# Patient Record
Sex: Male | Born: 1975 | Race: White | Hispanic: No | Marital: Married | Smoking: Current every day smoker
Health system: Southern US, Community
[De-identification: ages and names within clinical notes are randomized; demographics above are authoritative.]

## PROBLEM LIST (undated history)

## (undated) DIAGNOSIS — Q211 Atrial septal defect: Secondary | ICD-10-CM

## (undated) DIAGNOSIS — Q2112 Patent foramen ovale: Secondary | ICD-10-CM

## (undated) DIAGNOSIS — J45909 Unspecified asthma, uncomplicated: Secondary | ICD-10-CM

## (undated) DIAGNOSIS — E785 Hyperlipidemia, unspecified: Secondary | ICD-10-CM

## (undated) DIAGNOSIS — Z87442 Personal history of urinary calculi: Secondary | ICD-10-CM

## (undated) HISTORY — DX: Atrial septal defect: Q21.1

## (undated) HISTORY — DX: Patent foramen ovale: Q21.12

## (undated) HISTORY — DX: Hyperlipidemia, unspecified: E78.5

---

## 2005-05-09 ENCOUNTER — Emergency Department: Payer: Self-pay | Admitting: Emergency Medicine

## 2007-04-21 ENCOUNTER — Emergency Department: Payer: Self-pay | Admitting: Emergency Medicine

## 2019-04-24 ENCOUNTER — Other Ambulatory Visit: Payer: Self-pay

## 2019-04-24 ENCOUNTER — Telehealth: Payer: Self-pay | Admitting: Urology

## 2019-04-24 ENCOUNTER — Emergency Department: Payer: Federal, State, Local not specified - PPO

## 2019-04-24 ENCOUNTER — Emergency Department
Admission: EM | Admit: 2019-04-24 | Discharge: 2019-04-24 | Disposition: A | Discharge status: Home / Self Care | Payer: Federal, State, Local not specified - PPO | Attending: Student in an Organized Health Care Education/Training Program | Admitting: Student in an Organized Health Care Education/Training Program

## 2019-04-24 DIAGNOSIS — F17200 Nicotine dependence, unspecified, uncomplicated: Secondary | ICD-10-CM | POA: Insufficient documentation

## 2019-04-24 DIAGNOSIS — R3915 Urgency of urination: Secondary | ICD-10-CM | POA: Insufficient documentation

## 2019-04-24 DIAGNOSIS — N201 Calculus of ureter: Secondary | ICD-10-CM | POA: Diagnosis not present

## 2019-04-24 DIAGNOSIS — J45909 Unspecified asthma, uncomplicated: Secondary | ICD-10-CM | POA: Insufficient documentation

## 2019-04-24 DIAGNOSIS — R109 Unspecified abdominal pain: Secondary | ICD-10-CM

## 2019-04-24 DIAGNOSIS — R339 Retention of urine, unspecified: Secondary | ICD-10-CM | POA: Insufficient documentation

## 2019-04-24 DIAGNOSIS — R1030 Lower abdominal pain, unspecified: Secondary | ICD-10-CM | POA: Diagnosis present

## 2019-04-24 HISTORY — DX: Unspecified asthma, uncomplicated: J45.909

## 2019-04-24 LAB — COMPREHENSIVE METABOLIC PANEL
ALT: 13 U/L (ref 0–44)
AST: 18 U/L (ref 15–41)
Albumin: 4.4 g/dL (ref 3.5–5.0)
Alkaline Phosphatase: 62 U/L (ref 38–126)
Anion gap: 11 (ref 5–15)
BUN: 14 mg/dL (ref 6–20)
CO2: 25 mmol/L (ref 22–32)
Calcium: 8.7 mg/dL — ABNORMAL LOW (ref 8.9–10.3)
Chloride: 100 mmol/L (ref 98–111)
Creatinine, Ser: 1.34 mg/dL — ABNORMAL HIGH (ref 0.61–1.24)
GFR calc Af Amer: >60 mL/min (ref >60)
GFR calc non Af Amer: >60 mL/min (ref >60)
Glucose, Bld: 130 mg/dL — ABNORMAL HIGH (ref 70–99)
Potassium: 4.2 mmol/L (ref 3.5–5.1)
Sodium: 136 mmol/L (ref 135–145)
Total Bilirubin: 0.5 mg/dL (ref 0.3–1.2)
Total Protein: 7.8 g/dL (ref 6.5–8.1)

## 2019-04-24 LAB — CBC WITH DIFFERENTIAL/PLATELET
Abs Immature Granulocytes: 0.14 10*3/uL — ABNORMAL HIGH (ref 0.00–0.07)
Basophils Absolute: 0.1 10*3/uL (ref 0.0–0.1)
Basophils Relative: 0 %
Eosinophils Absolute: 0.1 10*3/uL (ref 0.0–0.5)
Eosinophils Relative: 1 %
HCT: 30.5 % — ABNORMAL LOW (ref 39.0–52.0)
Hemoglobin: 9.7 g/dL — ABNORMAL LOW (ref 13.0–17.0)
Immature Granulocytes: 1 %
Lymphocytes Relative: 8 %
Lymphs Abs: 1.4 10*3/uL (ref 0.7–4.0)
MCH: 26 pg (ref 26.0–34.0)
MCHC: 31.8 g/dL (ref 30.0–36.0)
MCV: 81.8 fL (ref 80.0–100.0)
Monocytes Absolute: 1.4 10*3/uL — ABNORMAL HIGH (ref 0.1–1.0)
Monocytes Relative: 8 %
Neutro Abs: 13.5 10*3/uL — ABNORMAL HIGH (ref 1.7–7.7)
Neutrophils Relative %: 82 %
Platelets: 280 10*3/uL (ref 150–400)
RBC: 3.73 MIL/uL — ABNORMAL LOW (ref 4.22–5.81)
RDW: 16.2 % — ABNORMAL HIGH (ref 11.5–15.5)
WBC: 16.5 10*3/uL — ABNORMAL HIGH (ref 4.0–10.5)
nRBC: 0 % (ref 0.0–0.2)

## 2019-04-24 LAB — URINALYSIS, COMPLETE (UACMP) WITH MICROSCOPIC
Bacteria, UA: NONE SEEN
Bilirubin Urine: NEGATIVE
Glucose, UA: NEGATIVE mg/dL
Hgb urine dipstick: NEGATIVE
Ketones, ur: NEGATIVE mg/dL
Leukocytes,Ua: NEGATIVE
Nitrite: NEGATIVE
Protein, ur: NEGATIVE mg/dL
Specific Gravity, Urine: 1.013 (ref 1.005–1.030)
Squamous Epithelial / HPF: NONE SEEN (ref 0–5)
pH: 8 (ref 5.0–8.0)

## 2019-04-24 LAB — TYPE AND SCREEN
ABO/RH(D): O NEG
Antibody Screen: NEGATIVE

## 2019-04-24 MED ORDER — ONDANSETRON HCL 4 MG/2ML IJ SOLN
INTRAMUSCULAR | Status: AC
  Filled 2019-04-24: qty 2

## 2019-04-24 MED ORDER — HYDROCODONE-ACETAMINOPHEN 5-325 MG PO TABS: 1.0000 | ORAL_TABLET | ORAL | 0 refills | Status: DC | PRN

## 2019-04-24 MED ORDER — TAMSULOSIN HCL 0.4 MG PO CAPS: 0.4000 mg | ORAL_CAPSULE | Freq: Every day | ORAL | 0 refills | Status: DC

## 2019-04-24 MED ORDER — ONDANSETRON HCL 4 MG PO TABS: 4.0000 mg | ORAL_TABLET | Freq: Every day | ORAL | 0 refills | Status: DC | PRN

## 2019-04-24 MED ORDER — MORPHINE SULFATE (PF) 4 MG/ML IV SOLN
INTRAVENOUS | Status: AC
  Filled 2019-04-24: qty 1

## 2019-04-24 MED ORDER — LIDOCAINE HCL URETHRAL/MUCOSAL 2 % EX GEL: 1.0000 "application " | Freq: Once | CUTANEOUS | Status: DC

## 2019-04-24 MED ORDER — ONDANSETRON HCL 4 MG/2ML IJ SOLN
4.0000 mg | Freq: Once | INTRAMUSCULAR | Status: AC
  Administered 2019-04-24: 4 mg via INTRAVENOUS

## 2019-04-24 MED ORDER — KETOROLAC TROMETHAMINE 30 MG/ML IJ SOLN
15.0000 mg | Freq: Once | INTRAMUSCULAR | Status: AC
  Administered 2019-04-24: 15 mg via INTRAVENOUS
  Filled 2019-04-24: qty 1

## 2019-04-24 MED ORDER — SODIUM CHLORIDE 0.9 % IV BOLUS
1000.0000 mL | Freq: Once | INTRAVENOUS | Status: AC
  Administered 2019-04-24: 1000 mL via INTRAVENOUS

## 2019-04-24 MED ORDER — IOHEXOL 300 MG/ML  SOLN
100.0000 mL | Freq: Once | INTRAMUSCULAR | Status: AC | PRN
  Administered 2019-04-24: 100 mL via INTRAVENOUS

## 2019-04-24 MED ORDER — FENTANYL CITRATE (PF) 100 MCG/2ML IJ SOLN
50.0000 ug | INTRAMUSCULAR | Status: DC | PRN
  Administered 2019-04-24: 50 ug via INTRAVENOUS
  Filled 2019-04-24: qty 2

## 2019-04-24 MED ORDER — MORPHINE SULFATE (PF) 4 MG/ML IV SOLN
4.0000 mg | INTRAVENOUS | Status: DC | PRN
  Administered 2019-04-24: 4 mg via INTRAVENOUS

## 2019-04-24 NOTE — Telephone Encounter (Signed)
Pt was seen in the ER for kidney stone and told to follow up with office.  Please advise.

## 2019-04-24 NOTE — ED Triage Notes (Signed)
Pt c/o not being able to pass but a small amount of urine for the past 24 hrs and is c/o a lot of lower abd pain.

## 2019-04-24 NOTE — Telephone Encounter (Signed)
Virtual visit, any provider  Vanna Scotland, MD

## 2019-04-24 NOTE — ED Provider Notes (Signed)
[facility] Emergency Department Provider Note    First MD Initiated Contact with Patient 04/24/19 [phone]     (approximate)  I have reviewed the triage vital signs and the nursing notes.   HISTORY  Chief Complaint Urinary Retention    HPI Kyle ShuttersJames L Saylor is a 43 y.o. male below listed past medical history presents the ER for evaluation of suprapubic pain as well as right flank pain that started last night.  States he is felt the urgency and need to urinate but has been unable to empty his bladder.  Denies any history of prostate issues.  Denies any dysuria or hematuria.  No fevers.  Is never had symptoms like this before.  No new medications.  Has not taken anything at home to try to alleviate the pain.  States it is constant in nature.  Past Medical History:  Diagnosis Date  . Asthma    No family history on file. History reviewed. No pertinent surgical history. There are no active problems to display for this patient.     Prior to Admission medications   Medication Sig Start Date End Date Taking? Authorizing Provider  HYDROcodone-acetaminophen (NORCO) 5-325 MG tablet Take 1 tablet by mouth every 4 (four) hours as needed for moderate pain. 04/24/19   Willy Eddyobinson, Wandell Scullion, MD  ondansetron (ZOFRAN) 4 MG tablet Take 1 tablet (4 mg total) by mouth daily as needed. 04/24/19 04/23/20  Willy Eddyobinson, Aristotle Lieb, MD  tamsulosin (FLOMAX) 0.4 MG CAPS capsule Take 1 capsule (0.4 mg total) by mouth daily after supper. 04/24/19   Willy Eddyobinson, Mignon Bechler, MD    Allergies Patient has no known allergies.    Social History Social History   Tobacco Use  . Smoking status: Current Every Day Smoker  . Smokeless tobacco: Never Used  Substance Use Topics  . Alcohol use: Not on file  . Drug use: Not Currently    Review of Systems Patient denies headaches, rhinorrhea, blurry vision, numbness, shortness of breath, chest pain, edema, cough, abdominal pain, nausea, vomiting, diarrhea,  dysuria, fevers, rashes or hallucinations unless otherwise stated above in HPI. ____________________________________________   PHYSICAL EXAM:  VITAL SIGNS: Vitals:   04/24/19 0934 04/24/19 1055  BP: 123/71 113/78  Pulse: 78 68  Resp: 18 16  Temp: 97.8 F (36.6 C)   SpO2: 100% 99%    Constitutional: Alert and oriented. uncomfortable appearing but in no acute distress. Eyes: Conjunctivae are normal.  Head: Atraumatic. Nose: No congestion/rhinnorhea. Mouth/Throat: Mucous membranes are moist.   Neck: Painless ROM.  Cardiovascular:   Good peripheral circulation. Respiratory: Normal respiratory effort.  No retractions.  Gastrointestinal: Soft and nontender.  Musculoskeletal: No lower extremity tenderness .  No joint effusions. Neurologic:  Normal speech and language. No gross focal neurologic deficits are appreciated.  Skin:  Skin is warm, dry and intact. No rash noted. Psychiatric: Mood and affect are normal. Speech and behavior are normal.  ____________________________________________   LABS (all labs ordered are listed, but only abnormal results are displayed)  Results for orders placed or performed during the hospital encounter of 04/24/19 (from the past 24 hour(s))  CBC with Differential/Platelet     Status: Abnormal   Collection Time: 04/24/19  8:24 AM  Result Value Ref Range   WBC 16.5 (H) 4.0 - 10.5 K/uL   RBC 3.73 (L) 4.22 - 5.81 MIL/uL   Hemoglobin 9.7 (L) 13.0 - 17.0 g/dL   HCT 96.030.5 (L) 45.439.0 - 09.852.0 %   MCV 81.8 80.0 - 100.0 fL  MCH 26.0 26.0 - 34.0 pg   MCHC 31.8 30.0 - 36.0 g/dL   RDW 16.1 (H) 09.6 - 04.5 %   Platelets 280 150 - 400 K/uL   nRBC 0.0 0.0 - 0.2 %   Neutrophils Relative % 82 %   Neutro Abs 13.5 (H) 1.7 - 7.7 K/uL   Lymphocytes Relative 8 %   Lymphs Abs 1.4 0.7 - 4.0 K/uL   Monocytes Relative 8 %   Monocytes Absolute 1.4 (H) 0.1 - 1.0 K/uL   Eosinophils Relative 1 %   Eosinophils Absolute 0.1 0.0 - 0.5 K/uL   Basophils Relative 0 %    Basophils Absolute 0.1 0.0 - 0.1 K/uL   Immature Granulocytes 1 %   Abs Immature Granulocytes 0.14 (H) 0.00 - 0.07 K/uL  Comprehensive metabolic panel     Status: Abnormal   Collection Time: 04/24/19  8:24 AM  Result Value Ref Range   Sodium 136 135 - 145 mmol/L   Potassium 4.2 3.5 - 5.1 mmol/L   Chloride 100 98 - 111 mmol/L   CO2 25 22 - 32 mmol/L   Glucose, Bld 130 (H) 70 - 99 mg/dL   BUN 14 6 - 20 mg/dL   Creatinine, Ser 4.09 (H) 0.61 - 1.24 mg/dL   Calcium 8.7 (L) 8.9 - 10.3 mg/dL   Total Protein 7.8 6.5 - 8.1 g/dL   Albumin 4.4 3.5 - 5.0 g/dL   AST 18 15 - 41 U/L   ALT 13 0 - 44 U/L   Alkaline Phosphatase 62 38 - 126 U/L   Total Bilirubin 0.5 0.3 - 1.2 mg/dL   GFR calc non Af Amer >60 >60 mL/min   GFR calc Af Amer >60 >60 mL/min   Anion gap 11 5 - 15  Type and screen [facility]     Status: None   Collection Time: 04/24/19  8:48 AM  Result Value Ref Range   ABO/RH(D) O NEG    Antibody Screen NEG    Sample Expiration      04/27/2019,2359 Performed at [facility], [address] [place], [place] [address]   Urinalysis, Complete w Microscopic     Status: Abnormal   Collection Time: 04/24/19  9:24 AM  Result Value Ref Range   Color, Urine YELLOW (A) YELLOW   APPearance CLOUDY (A) CLEAR   Specific Gravity, Urine 1.013 1.005 - 1.030   pH 8.0 5.0 - 8.0   Glucose, UA NEGATIVE NEGATIVE mg/dL   Hgb urine dipstick NEGATIVE NEGATIVE   Bilirubin Urine NEGATIVE NEGATIVE   Ketones, ur NEGATIVE NEGATIVE mg/dL   Protein, ur NEGATIVE NEGATIVE mg/dL   Nitrite NEGATIVE NEGATIVE   Leukocytes,Ua NEGATIVE NEGATIVE   RBC / HPF 0-5 0 - 5 RBC/hpf   WBC, UA 0-5 0 - 5 WBC/hpf   Bacteria, UA NONE SEEN NONE SEEN   Squamous Epithelial / LPF NONE SEEN 0 - 5   Amorphous Crystal PRESENT    ____________________________________________ ____________________________________________  RADIOLOGY  I personally reviewed all radiographic images ordered to evaluate  for the above acute complaints and reviewed radiology reports and findings.  These findings were personally discussed with the patient.  Please see medical record for radiology report.  ____________________________________________   PROCEDURES  Procedure(s) performed:  Procedures    Critical Care performed: no ____________________________________________   INITIAL IMPRESSION / ASSESSMENT AND PLAN / ED COURSE  Pertinent labs & imaging results that were available during my care of the patient were reviewed by  me and considered in my medical decision making (see chart for details).  DDX: stone, cystitis, bph, retention, appy, diverticulitis, mass  Kyle Ware is a 43 y.o. who presents to the ED with symptoms as described above.  Patient uncomfortable appearing afebrile hemodynamically stable but is quite tender right lower quadrant.  Bladder scan shows no evidence of urinary retention.  Possible stone.  Blood work shows evidence of leukocytosis with low hemoglobin 9.7.  Denies any melena or hematochezia.  CT imaging will be ordered for the by differential.  Will provide IV fluids as well as IV pain medication.  Clinical Course as of Apr 23 1128  Wed Apr 24, 2019  1030 CT imaging shows evidence of stone.  Does have leukocytosis but no fever and urinalysis is clear.  Will consult urology.   [PR]  1042 Discussed the case with urology who agrees with plan for outpatient follow-up.  Will give dose of Toradol.   [PR]  1117 Patient's pain improved.  Remains hemodynamically stable and appropriate for outpatient follow-up with urology referral.   [PR]    Clinical Course User Index [PR] Willy Eddy, MD    The patient was evaluated in Emergency Department today for the symptoms described in the history of present illness. He/she was evaluated in the context of the global COVID-19 pandemic, which necessitated consideration that the patient might be at risk for infection with the  SARS-CoV-2 virus that causes COVID-19. Institutional protocols and algorithms that pertain to the evaluation of patients at risk for COVID-19 are in a state of rapid change based on information released by regulatory bodies including the CDC and federal and state organizations. These policies and algorithms were followed during the patient's care in the ED.  ____________________________________________   FINAL CLINICAL IMPRESSION(S) / ED DIAGNOSES  Final diagnoses:  Ureterolithiasis  Right flank pain      NEW MEDICATIONS STARTED DURING THIS VISIT:  New Prescriptions   HYDROCODONE-ACETAMINOPHEN (NORCO) 5-325 MG TABLET    Take 1 tablet by mouth every 4 (four) hours as needed for moderate pain.   ONDANSETRON (ZOFRAN) 4 MG TABLET    Take 1 tablet (4 mg total) by mouth daily as needed.   TAMSULOSIN (FLOMAX) 0.4 MG CAPS CAPSULE    Take 1 capsule (0.4 mg total) by mouth daily after supper.     Note:  This document was prepared using Dragon voice recognition software and may include unintentional dictation errors.     Willy Eddy, MD 04/24/19 [phone]

## 2019-04-25 ENCOUNTER — Telehealth (INDEPENDENT_AMBULATORY_CARE_PROVIDER_SITE_OTHER): Payer: Federal, State, Local not specified - PPO | Admitting: Urology

## 2019-04-25 DIAGNOSIS — N201 Calculus of ureter: Secondary | ICD-10-CM | POA: Diagnosis not present

## 2019-04-25 DIAGNOSIS — N23 Unspecified renal colic: Secondary | ICD-10-CM

## 2019-04-25 DIAGNOSIS — N132 Hydronephrosis with renal and ureteral calculous obstruction: Secondary | ICD-10-CM | POA: Diagnosis not present

## 2019-04-25 NOTE — Progress Notes (Signed)
Virtual Visit via Video Note  I connected with Kyle Ware on 04/25/19 at  1:30 PM EDT by a video enabled telemedicine application and verified that I am speaking with the correct person using two identifiers.  Location: Patient: Home Provider: Office   I discussed the limitations of evaluation and management by telemedicine and the availability of in person appointments. The patient expressed understanding and agreed to proceed.  History of Present Illness: 43 year old male called to schedule a video visit after a recent ED visit for renal colic.  He presented to the ED on 04/24/2019 with a several hour history of right flank pain and suprapubic pain.  He also complained of urinary urgency, voiding small amounts with sensation of incomplete emptying.  There were no identifiable precipitating, aggravating or alleviating factors.  Urinalysis was unremarkable.  A CT of the abdomen pelvis with contrast was performed which showed mild hydronephrosis and hydroureter which extended to the distal ureter felt secondary to a subtotal 1-2 mm distal calculus.  He was discharged on oral analgesics and tamsulosin.  Since his ED visit his pain has resolved.  Denies previous history of stone disease or prior urologic problems.   Observations/Objective: Alert, in no acute distress  Assessment and Plan: CT was reviewed.  There is mild hydroureter.  There may be a small calcification on coronal images.  The axial images appear to be more diffuse hyperdense material.  He is presently asymptomatic.  Follow Up Instructions: Follow-up office visit 2-3 weeks for KUB.  If he remains asymptomatic would recommend follow-up imaging.   I discussed the assessment and treatment plan with the patient. The patient was provided an opportunity to ask questions and all were answered. The patient agreed with the plan and demonstrated an understanding of the instructions.   The patient was advised to call back or seek  an in-person evaluation if the symptoms worsen or if the condition fails to improve as anticipated.  I provided 5 minutes of non-face-to-face time during this encounter.   Riki Altes, MD

## 2019-04-29 ENCOUNTER — Telehealth: Payer: Self-pay | Admitting: Urology

## 2019-04-29 MED ORDER — HYDROCODONE-ACETAMINOPHEN 5-325 MG PO TABS: 1.0000 | ORAL_TABLET | ORAL | 0 refills | Status: DC | PRN

## 2019-04-29 NOTE — Telephone Encounter (Signed)
Please advise on pain medicine

## 2019-04-29 NOTE — Telephone Encounter (Signed)
Pt came by and dropped of FMLA paperwork to be filled out, He asked if he could also have some pain medicine called in. Please advise.

## 2019-04-29 NOTE — Telephone Encounter (Signed)
rx sent

## 2019-04-29 NOTE — Addendum Note (Signed)
Addended by: Riki Altes on: 04/29/2019 09:31 PM   Modules accepted: Orders

## 2019-04-30 NOTE — Telephone Encounter (Signed)
Patient notified

## 2019-05-03 ENCOUNTER — Telehealth: Payer: Self-pay | Admitting: Radiology

## 2019-05-03 NOTE — Telephone Encounter (Signed)
LMOM to call back regarding FMLA paperwork.

## 2019-05-16 ENCOUNTER — Ambulatory Visit: Payer: Federal, State, Local not specified - PPO | Admitting: Urology

## 2019-05-16 ENCOUNTER — Encounter: Payer: Self-pay | Admitting: Urology

## 2019-11-29 DIAGNOSIS — I639 Cerebral infarction, unspecified: Secondary | ICD-10-CM

## 2019-11-29 HISTORY — DX: Cerebral infarction, unspecified: I63.9

## 2020-03-25 ENCOUNTER — Other Ambulatory Visit: Payer: Self-pay

## 2020-03-25 ENCOUNTER — Inpatient Hospital Stay (HOSPITAL_COMMUNITY)
Admission: EM | Admit: 2020-03-25 | Discharge: 2020-03-27 | DRG: 062 | Disposition: A | Discharge status: Home / Self Care | Payer: Federal, State, Local not specified - PPO | Attending: Neurology | Admitting: Neurology

## 2020-03-25 ENCOUNTER — Emergency Department (HOSPITAL_COMMUNITY): Payer: Federal, State, Local not specified - PPO

## 2020-03-25 ENCOUNTER — Encounter (HOSPITAL_COMMUNITY): Payer: Self-pay

## 2020-03-25 DIAGNOSIS — R29707 NIHSS score 7: Secondary | ICD-10-CM | POA: Diagnosis present

## 2020-03-25 DIAGNOSIS — D72829 Elevated white blood cell count, unspecified: Secondary | ICD-10-CM | POA: Diagnosis present

## 2020-03-25 DIAGNOSIS — F17200 Nicotine dependence, unspecified, uncomplicated: Secondary | ICD-10-CM | POA: Diagnosis present

## 2020-03-25 DIAGNOSIS — E785 Hyperlipidemia, unspecified: Secondary | ICD-10-CM | POA: Diagnosis present

## 2020-03-25 DIAGNOSIS — I639 Cerebral infarction, unspecified: Secondary | ICD-10-CM

## 2020-03-25 DIAGNOSIS — G8194 Hemiplegia, unspecified affecting left nondominant side: Secondary | ICD-10-CM | POA: Diagnosis present

## 2020-03-25 DIAGNOSIS — Q211 Atrial septal defect: Secondary | ICD-10-CM | POA: Diagnosis not present

## 2020-03-25 DIAGNOSIS — Z8616 Personal history of COVID-19: Secondary | ICD-10-CM | POA: Diagnosis not present

## 2020-03-25 DIAGNOSIS — I63411 Cerebral infarction due to embolism of right middle cerebral artery: Secondary | ICD-10-CM | POA: Diagnosis not present

## 2020-03-25 DIAGNOSIS — E7849 Other hyperlipidemia: Secondary | ICD-10-CM

## 2020-03-25 DIAGNOSIS — N181 Chronic kidney disease, stage 1: Secondary | ICD-10-CM | POA: Diagnosis present

## 2020-03-25 DIAGNOSIS — I6389 Other cerebral infarction: Secondary | ICD-10-CM | POA: Diagnosis not present

## 2020-03-25 DIAGNOSIS — Z823 Family history of stroke: Secondary | ICD-10-CM

## 2020-03-25 DIAGNOSIS — R29810 Facial weakness: Secondary | ICD-10-CM | POA: Diagnosis present

## 2020-03-25 LAB — POCT I-STAT, CHEM 8
BUN: 31 mg/dL — ABNORMAL HIGH (ref 6–20)
Calcium, Ion: 1.07 mmol/L — ABNORMAL LOW (ref 1.15–1.40)
Chloride: 100 mmol/L (ref 98–111)
Creatinine, Ser: 1.4 mg/dL — ABNORMAL HIGH (ref 0.61–1.24)
Glucose, Bld: 128 mg/dL — ABNORMAL HIGH (ref 70–99)
HCT: 38 % — ABNORMAL LOW (ref 39.0–52.0)
Hemoglobin: 12.9 g/dL — ABNORMAL LOW (ref 13.0–17.0)
Potassium: 4.5 mmol/L (ref 3.5–5.1)
Sodium: 134 mmol/L — ABNORMAL LOW (ref 135–145)
TCO2: 25 mmol/L (ref 22–32)

## 2020-03-25 LAB — CBG MONITORING, ED: Glucose-Capillary: 130 mg/dL — ABNORMAL HIGH (ref 70–99)

## 2020-03-25 MED ORDER — NICARDIPINE HCL IN NACL 20-0.86 MG/200ML-% IV SOLN: 0.0000 mg/h | INTRAVENOUS | Status: DC | PRN

## 2020-03-25 MED ORDER — SODIUM CHLORIDE 0.9 % IV SOLN
50.0000 mL | Freq: Once | INTRAVENOUS | Status: AC
  Administered 2020-03-26: 50 mL via INTRAVENOUS

## 2020-03-25 MED ORDER — STROKE: EARLY STAGES OF RECOVERY BOOK
Freq: Once | Status: AC
  Filled 2020-03-25: qty 1

## 2020-03-25 MED ORDER — SODIUM CHLORIDE 0.9% FLUSH: 3.0000 mL | Freq: Once | INTRAVENOUS | Status: AC

## 2020-03-25 MED ORDER — ACETAMINOPHEN 325 MG PO TABS: 650.0000 mg | ORAL_TABLET | ORAL | Status: DC | PRN

## 2020-03-25 MED ORDER — ACETAMINOPHEN 650 MG RE SUPP: 650.0000 mg | RECTAL | Status: DC | PRN

## 2020-03-25 MED ORDER — SODIUM CHLORIDE 0.9 % IV SOLN
50.0000 mL/h | INTRAVENOUS | Status: DC
  Administered 2020-03-26 – 2020-03-27 (×3): 50 mL/h via INTRAVENOUS

## 2020-03-25 MED ORDER — LABETALOL HCL 5 MG/ML IV SOLN: 10.0000 mg | Freq: Once | INTRAVENOUS | Status: DC | PRN

## 2020-03-25 MED ORDER — ALTEPLASE (STROKE) FULL DOSE INFUSION
0.9000 mg/kg | Freq: Once | INTRAVENOUS | Status: AC
  Administered 2020-03-25: 69.6 mg via INTRAVENOUS

## 2020-03-25 MED ORDER — ACETAMINOPHEN 160 MG/5ML PO SOLN: 650.0000 mg | ORAL | Status: DC | PRN

## 2020-03-25 MED ORDER — PANTOPRAZOLE SODIUM 40 MG IV SOLR
40.0000 mg | Freq: Every day | INTRAVENOUS | Status: DC
  Administered 2020-03-26: 40 mg via INTRAVENOUS
  Filled 2020-03-25 (×2): qty 40

## 2020-03-25 MED ORDER — IOHEXOL 350 MG/ML SOLN
80.0000 mL | Freq: Once | INTRAVENOUS | Status: AC | PRN
  Administered 2020-03-25: 80 mL via INTRAVENOUS

## 2020-03-25 NOTE — H&P (Addendum)
Neurology H&P  CC: Left sided weakness  History is obtained from:patient  HPI: Kyle Ware is a 44 y.o. male with no PMH who presents with acute left sided weakness.  He was in his normal state of health this afternoon and then sometime after 10:15 PM developed acute left-sided weakness.  Due to this, he called EMS and was transported as a code stroke.  On arrival to [facility], he was taken for a stat head CT and IV TPA was started.  Of note, he was recently diagnosed with Covid with symptoms starting on April 10, now completely resolved.  LKW: 10:15 tpa given?: yes IR Thrombectomy? No, no LVO identified Modified Rankin Scale: 0-Completely asymptomatic and back to baseline post- stroke NIHSS: 7   ROS: A complete ROS was performed and is negative except as noted in the HPI.   Past Medical History:  Diagnosis Date  . Asthma      Family history: Grandmother with stroke   Social History:  reports that he has been smoking. He has never used smokeless tobacco. He reports previous drug use. No history on file for alcohol.   Prior to Admission medications   Medication Sig Start Date End Date Taking? Authorizing Provider  HYDROcodone-acetaminophen (NORCO) 5-325 MG tablet Take 1 tablet by mouth every 4 (four) hours as needed for moderate pain. 04/29/19   Stoioff, Verna Czech, MD  ondansetron (ZOFRAN) 4 MG tablet Take 1 tablet (4 mg total) by mouth daily as needed. 04/24/19 04/23/20  Willy Eddy, MD  tamsulosin (FLOMAX) 0.4 MG CAPS capsule Take 1 capsule (0.4 mg total) by mouth daily after supper. 04/24/19   Willy Eddy, MD     Exam: Current vital signs: BP 112/79 (BP Location: Right Arm)   Pulse 88   Resp 18   Wt 77.3 kg   SpO2 100%   BMI 26.69 kg/m    Physical Exam  Constitutional: Appears well-developed and well-nourished.  Psych: Affect appropriate to situation Eyes: No scleral injection HENT: No OP obstrucion Head: Normocephalic.  Cardiovascular: Normal  rate and regular rhythm.  Respiratory: Effort normal and breath sounds normal to anterior ascultation GI: Soft.  No distension. There is no tenderness.  Skin: WDI  Neuro: Mental Status: Patient is awake, alert, oriented to person, place, month, year, and situation. Patient is able to give a clear and coherent history. No signs of aphasia or neglect Cranial Nerves: II: He has a left lower quadrantanopia pupils are equal, round, and reactive to light.   III,IV, VI: EOMI without ptosis or diploplia.  V: Facial sensation is symmetric to temperature VII: Facial movement with left-sided weakness VIII: hearing is intact to voice X: Uvula elevates symmetrically XI: Shoulder shrug is symmetric. XII: tongue is midline without atrophy or fasciculations.  Motor: Tone is normal. Bulk is normal. 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  He has no known medical risk factors other than his recent Covid infection.  I am not certain what is causing his leukocytosis.  I question whether it could be due to Covid..   Plan: CVA - HgbA1c, fasting lipid panel - MRI of the brain without contrast - Frequent neuro checks - Echocardiogram - Prophylactic therapy-Antiplatelet med: None for 24 hours - Risk factor modification - Telemetry monitoring - PT  consult, OT consult, Speech consult - Stroke team to follow  Leukocytosis - will check procalcitonin for evidence of bacterial co-infection  This patient is critically ill and at significant risk of neurological worsening, death and care requires constant monitoring of vital signs, hemodynamics,respiratory and cardiac monitoring, neurological assessment, discussion with family, other specialists and medical decision making of high complexity. I spent 50 minutes of neurocritical care time  in the care of  this patient. This was time spent independent of any time provided by nurse practitioner or PA.  Roland Rack, MD [facility] [phone]  If 7pm- 7am, please page neurology on call as listed in [place]. 03/26/2020  12:06 AM

## 2020-03-25 NOTE — ED Triage Notes (Signed)
Pt BIB [facility] for eval as a code stroke. Pt was normal at 2215, at 2230 noted L sided facial droop, L arm numbness and weakness, L leg weakness and slurred speech. Activated 911 himself. EMS activated as a code stoke en route. Arrives w/ L sided facial droop, GCS 14, A&Ox3 (unsure of date).

## 2020-03-25 NOTE — Progress Notes (Signed)
Pharmacist Code Stroke Response  Notified to mix tPA at 2313 by Dr. Amada Jupiter Delivered tPA to RN at 2315  tPA dose = 7 mg bolus over 1 minute followed by 62.6 mg for a total dose of 69.6 mg over 1 hour  Issues/delays encountered (if applicable): None  Christoper Fabian, PharmD, BCPS Please see amion for complete clinical pharmacist phone list 03/25/20 11:22 PM

## 2020-03-25 NOTE — ED Provider Notes (Signed)
[facility] EMERGENCY DEPARTMENT Provider Note   CSN: [id number] Arrival date & time: 03/25/20  2311     History Chief Complaint  Patient presents with  . Code Stroke    Kyle Ware is a 44 y.o. male.  The history is provided by the patient, medical records and the EMS personnel.    44 y.o. M with hx of asthma, presenting to the ED as a CODE STROKE.  Patient was in usual state of health and around 10:30PM he started to notice some left sided tingling/numbness sensation, slurred speech, left sided facial droop.  CODE STROKE was activated by EMS and transported here.  Symptoms did mildly improve en route to the ED but did not completely resolve.  No hx of TIA or stroke.  He is not currently on anticoagulation.  He does report having COVID earlier this month, symptoms began on 03/07/20.  Past Medical History:  Diagnosis Date  . Asthma     There are no problems to display for this patient.   No past surgical history on file.     No family history on file.  Social History   Tobacco Use  . Smoking status: Current Every Day Smoker  . Smokeless tobacco: Never Used  Substance Use Topics  . Alcohol use: Not on file  . Drug use: Not Currently    Home Medications Prior to Admission medications   Medication Sig Start Date End Date Taking? Authorizing Provider  HYDROcodone-acetaminophen (NORCO) 5-325 MG tablet Take 1 tablet by mouth every 4 (four) hours as needed for moderate pain. 04/29/19   Stoioff, Ronda Fairly, MD  ondansetron (ZOFRAN) 4 MG tablet Take 1 tablet (4 mg total) by mouth daily as needed. 04/24/19 04/23/20  Merlyn Lot, MD  tamsulosin (FLOMAX) 0.4 MG CAPS capsule Take 1 capsule (0.4 mg total) by mouth daily after supper. 04/24/19   Merlyn Lot, MD    Allergies    Patient has no known allergies.  Review of Systems   Review of Systems  Neurological: Positive for speech difficulty and weakness.  All other systems reviewed and are  negative.   Physical Exam Updated Vital Signs BP 112/79 (BP Location: Right Arm)   Pulse 88   Resp 18   Wt 77.3 kg   SpO2 100%   BMI 26.69 kg/m   Physical Exam Vitals and nursing note reviewed.  Constitutional:      Appearance: He is well-developed.  HENT:     Head: Normocephalic and atraumatic.     Mouth/Throat:     Comments: No drooling or stridor, protecting airway Eyes:     Conjunctiva/sclera: Conjunctivae normal.     Pupils: Pupils are equal, round, and reactive to light.     Comments: Eyes tracking normally, PERRL  Cardiovascular:     Rate and Rhythm: Normal rate and regular rhythm.     Heart sounds: Normal heart sounds.  Pulmonary:     Effort: Pulmonary effort is normal.     Breath sounds: Normal breath sounds.  Abdominal:     General: Bowel sounds are normal.     Palpations: Abdomen is soft.  Musculoskeletal:        General: Normal range of motion.     Cervical back: Normal range of motion.  Skin:    General: Skin is warm and dry.  Neurological:     Mental Status: He is alert and oriented to person, place, and time.     Comments: Awake, alert, oriented to  baseline, responds to questions and follows commands when prompted, speech sounds a bit slurred, left sided upper and lower extremity weakness compared with right, decreased sensation of left arm, otherwise normal sensation throughout, slight left facial droop     ED Results / Procedures / Treatments   Labs (all labs ordered are listed, but only abnormal results are displayed) Labs Reviewed  CBC - Abnormal; Notable for the following components:      Result Value   WBC 18.7 (*)    RBC 4.09 (*)    Hemoglobin 9.4 (*)    HCT 32.2 (*)    MCV 78.7 (*)    MCH 23.0 (*)    MCHC 29.2 (*)    RDW 17.7 (*)    All other components within normal limits  DIFFERENTIAL - Abnormal; Notable for the following components:   Neutro Abs 16.1 (*)    Monocytes Absolute 1.5 (*)    Abs Immature Granulocytes 0.17 (*)     All other components within normal limits  CBG MONITORING, ED - Abnormal; Notable for the following components:   Glucose-Capillary 130 (*)    All other components within normal limits  POCT I-STAT, CHEM 8 - Abnormal; Notable for the following components:   Sodium 134 (*)    BUN 31 (*)    Creatinine, Ser 1.40 (*)    Glucose, Bld 128 (*)    Calcium, Ion 1.07 (*)    Hemoglobin 12.9 (*)    HCT 38.0 (*)    All other components within normal limits  PROTIME-INR  APTT  COMPREHENSIVE METABOLIC PANEL  HIV ANTIBODY (ROUTINE TESTING W REFLEX)  CBC  HEMOGLOBIN A1C  LIPID PANEL  I-STAT CHEM 8, ED    EKG None  Radiology CT Code Stroke CTA Head W/WO contrast  Result Date: 03/26/2020 CLINICAL DATA:  Initial evaluation for acute stroke, left-sided facial droop and weakness, abnormal gaze, slurred speech. EXAM: CT ANGIOGRAPHY HEAD AND NECK TECHNIQUE: Multidetector CT imaging of the head and neck was performed using the standard protocol during bolus administration of intravenous contrast. Multiplanar CT image reconstructions and MIPs were obtained to evaluate the vascular anatomy. Carotid stenosis measurements (when applicable) are obtained utilizing NASCET criteria, using the distal internal carotid diameter as the denominator. CONTRAST:  30mL OMNIPAQUE IOHEXOL 350 MG/ML SOLN COMPARISON:  Prior head CT from earlier same day. FINDINGS: CTA NECK FINDINGS Aortic arch: Visualized aortic arch of normal caliber with normal 3 vessel morphology. No hemodynamically significant stenosis or other abnormality about the origin of the great vessels. Visualized subclavian arteries widely patent. Right carotid system: Right common carotid artery widely patent from its origin to the bifurcation without stenosis. Irregular soft plaque seen about the right bifurcation/proximal right ICA with associated stenosis of up to 50% by NASCET criteria. Right ICA otherwise widely patent distally to the skull base without stenosis,  dissection or occlusion. Left carotid system: Left common and internal carotid arteries widely patent without stenosis, dissection or occlusion. No significant atheromatous narrowing about the left bifurcation. Vertebral arteries: Both vertebral arteries arise from the subclavian arteries. Left vertebral artery dominant with a diffusely hypoplastic right vertebral artery. Vertebral arteries widely patent within the neck without stenosis, dissection, or occlusion. Skeleton: No acute osseous abnormality. No discrete or worrisome osseous lesions. Other neck: No other acute soft tissue abnormality within the neck. No mass lesion or adenopathy. Upper chest: Mild scattered tree in bud reticular densities seen within the peripheral right upper lobe, nonspecific, but suspicious for possible mild bronchiolitis/pneumonitis.  Visualized upper chest demonstrates no other acute finding. Review of the MIP images confirms the above findings CTA HEAD FINDINGS Anterior circulation: Both internal carotid arteries widely patent to the termini without stenosis or other abnormality. A1 segments widely patent. Normal anterior communicating artery. Anterior cerebral arteries widely patent to their distal aspects. No M1 stenosis or occlusion. Normal MCA bifurcations. Distal MCA branches well perfused and symmetric. Posterior circulation: Vertebral arteries widely patent to the vertebrobasilar junction without stenosis. Both picas patent. Basilar patent to its distal aspect without stenosis. Superior cerebral arteries patent bilaterally. Left PCA supplied via the basilar. Right PCA supplied via a hypoplastic right P1 segment and robust right posterior communicating artery. Both PCAs well perfused to their distal aspects without stenosis. Venous sinuses: Patent. Anatomic variants: Predominant fetal type origin of the right PCA. No aneurysm. Review of the MIP images confirms the above findings IMPRESSION: 1. Negative CTA for large vessel  occlusion. 2. Atheromatous plaque about the right carotid bifurcation/proximal right ICA with associated stenosis of up to 50% by NASCET criteria. 3. Otherwise wide patency of the major arterial vasculature of the head and neck. No other hemodynamically significant or correctable stenosis. No other significant atheromatous disease for age. 4. Mild tree-in-bud reticular densities within the peripheral right upper lobe, nonspecific, but suspicious for mild pneumonitis/small airways disease. These results were communicated to Dr. Amada Jupiter at 11:47 pmon 4/28/2021by text page via the [facility] messaging system. Electronically Signed   By: Rise Mu M.D.   On: 03/26/2020 00:03   CT Code Stroke CTA Neck W/WO contrast  Result Date: 03/26/2020 CLINICAL DATA:  Initial evaluation for acute stroke, left-sided facial droop and weakness, abnormal gaze, slurred speech. EXAM: CT ANGIOGRAPHY HEAD AND NECK TECHNIQUE: Multidetector CT imaging of the head and neck was performed using the standard protocol during bolus administration of intravenous contrast. Multiplanar CT image reconstructions and MIPs were obtained to evaluate the vascular anatomy. Carotid stenosis measurements (when applicable) are obtained utilizing NASCET criteria, using the distal internal carotid diameter as the denominator. CONTRAST:  22mL OMNIPAQUE IOHEXOL 350 MG/ML SOLN COMPARISON:  Prior head CT from earlier same day. FINDINGS: CTA NECK FINDINGS Aortic arch: Visualized aortic arch of normal caliber with normal 3 vessel morphology. No hemodynamically significant stenosis or other abnormality about the origin of the great vessels. Visualized subclavian arteries widely patent. Right carotid system: Right common carotid artery widely patent from its origin to the bifurcation without stenosis. Irregular soft plaque seen about the right bifurcation/proximal right ICA with associated stenosis of up to 50% by NASCET criteria. Right ICA otherwise widely  patent distally to the skull base without stenosis, dissection or occlusion. Left carotid system: Left common and internal carotid arteries widely patent without stenosis, dissection or occlusion. No significant atheromatous narrowing about the left bifurcation. Vertebral arteries: Both vertebral arteries arise from the subclavian arteries. Left vertebral artery dominant with a diffusely hypoplastic right vertebral artery. Vertebral arteries widely patent within the neck without stenosis, dissection, or occlusion. Skeleton: No acute osseous abnormality. No discrete or worrisome osseous lesions. Other neck: No other acute soft tissue abnormality within the neck. No mass lesion or adenopathy. Upper chest: Mild scattered tree in bud reticular densities seen within the peripheral right upper lobe, nonspecific, but suspicious for possible mild bronchiolitis/pneumonitis. Visualized upper chest demonstrates no other acute finding. Review of the MIP images confirms the above findings CTA HEAD FINDINGS Anterior circulation: Both internal carotid arteries widely patent to the termini without stenosis or other abnormality. A1 segments widely  patent. Normal anterior communicating artery. Anterior cerebral arteries widely patent to their distal aspects. No M1 stenosis or occlusion. Normal MCA bifurcations. Distal MCA branches well perfused and symmetric. Posterior circulation: Vertebral arteries widely patent to the vertebrobasilar junction without stenosis. Both picas patent. Basilar patent to its distal aspect without stenosis. Superior cerebral arteries patent bilaterally. Left PCA supplied via the basilar. Right PCA supplied via a hypoplastic right P1 segment and robust right posterior communicating artery. Both PCAs well perfused to their distal aspects without stenosis. Venous sinuses: Patent. Anatomic variants: Predominant fetal type origin of the right PCA. No aneurysm. Review of the MIP images confirms the above  findings IMPRESSION: 1. Negative CTA for large vessel occlusion. 2. Atheromatous plaque about the right carotid bifurcation/proximal right ICA with associated stenosis of up to 50% by NASCET criteria. 3. Otherwise wide patency of the major arterial vasculature of the head and neck. No other hemodynamically significant or correctable stenosis. No other significant atheromatous disease for age. 4. Mild tree-in-bud reticular densities within the peripheral right upper lobe, nonspecific, but suspicious for mild pneumonitis/small airways disease. These results were communicated to Dr. Amada Jupiter at 11:47 pmon 4/28/2021by text page via the [facility] messaging system. Electronically Signed   By: Rise Mu M.D.   On: 03/26/2020 00:03   CT HEAD CODE STROKE WO CONTRAST  Result Date: 03/25/2020 CLINICAL DATA:  Code stroke. Initial evaluation for acute left-sided facial droop, weakness, slurred speech, abnormal gaze., on EXAM: CT HEAD WITHOUT CONTRAST TECHNIQUE: Contiguous axial images were obtained from the base of the skull through the vertex without intravenous contrast. COMPARISON:  None available. FINDINGS: Brain: Cerebral volume within normal limits for patient age. No evidence for acute intracranial hemorrhage. No findings to suggest acute large vessel territory infarct. No mass lesion, midline shift, or mass effect. Ventricles are normal in size without evidence for hydrocephalus. No extra-axial fluid collection identified. Vascular: No hyperdense vessel identified. Skull: Scalp soft tissues demonstrate no acute abnormality. Calvarium intact. Sinuses/Orbits: Globes and orbital soft tissues within normal limits. Visualized paranasal sinuses are clear. No mastoid effusion. ASPECTS [facility] Stroke Program Early CT Score) - Ganglionic level infarction (caudate, lentiform nuclei, internal capsule, insula, M1-M3 cortex): 7 - Supraganglionic infarction (M4-M6 cortex): 3 Total score (0-10 with 10 being normal): 10  IMPRESSION: 1. Negative head CT.  No acute intracranial abnormality identified. 2. ASPECTS is 10. Critical Value/emergent results were called by telephone at the time of interpretation on 03/25/2020 at 11:18 pm to provider Dr. Amada Jupiter, Who verbally acknowledged these results. Electronically Signed   By: Rise Mu M.D.   On: 03/25/2020 23:24    Procedures Procedures (including critical care time)  CRITICAL CARE Performed by: Garlon Hatchet   Total critical care time: 45 minutes  Critical care time was exclusive of separately billable procedures and treating other patients.  Critical care was necessary to treat or prevent imminent or life-threatening deterioration.  Critical care was time spent personally by me on the following activities: development of treatment plan with patient and/or surrogate as well as nursing, discussions with consultants, evaluation of patient's response to treatment, examination of patient, obtaining history from patient or surrogate, ordering and performing treatments and interventions, ordering and review of laboratory studies, ordering and review of radiographic studies, pulse oximetry and re-evaluation of patient's condition.   Medications Ordered in ED Medications  sodium chloride flush (NS) 0.9 % injection 3 mL (has no administration in time range)  alteplase (ACTIVASE) 1 mg/mL infusion 69.6 mg (0 mg/kg  77.3 kg Intravenous Stopped 03/26/20 0018)    Followed by  0.9 %  sodium chloride infusion (has no administration in time range)   stroke: mapping our early stages of recovery book (has no administration in time range)  0.9 %  sodium chloride infusion (has no administration in time range)  acetaminophen (TYLENOL) tablet 650 mg (has no administration in time range)    Or  acetaminophen (TYLENOL) 160 MG/5ML solution 650 mg (has no administration in time range)    Or  acetaminophen (TYLENOL) suppository 650 mg (has no administration in time  range)  labetalol (NORMODYNE) injection 10 mg (has no administration in time range)    And  nicardipine (CARDENE) 20mg  in 0.86% saline 200ml IV infusion (0.1 mg/ml) (has no administration in time range)  pantoprazole (PROTONIX) injection 40 mg (has no administration in time range)  iohexol (OMNIPAQUE) 350 MG/ML injection 80 mL (80 mLs Intravenous Contrast Given 03/25/20 2327)    ED Course  I have reviewed the triage vital signs and the nursing notes.  Pertinent labs & imaging results that were available during my care of the patient were reviewed by me and considered in my medical decision making (see chart for details).    MDM Rules/Calculators/A&P  44 year old male presenting to the ED as a code stroke.  This was activated by EMS.  Last seen normal 10:30 PM when he began having left-sided tingling, facial droop, and slurred speech.  Symptoms did improve en route to the ED but have not fully resolved.  Patient awake/alert, protecting airway on arrival.  Patient immediately went to CT, no acute bleed seen and was given TPA by neurology.    Neurologic exam as above.  Patient remains hemodynamically stable here in the ED.  Will admit to neuro ICU.  Final Clinical Impression(s) / ED Diagnoses Final diagnoses:  Cerebrovascular accident (CVA), unspecified mechanism [facility](HCC)    Rx / DC Orders ED Discharge Orders    None       Garlon HatchetSanders, Leopold Smyers M, PA-C 03/26/20 0041    Mesner, Barbara CowerJason, MD 03/26/20 [phone]

## 2020-03-25 NOTE — Code Documentation (Signed)
Responded to Code Stroke called at 2257 for L sided facial droop, weakness, abnormal gaze, and slurred speech, [id number]. Pt arrived at 2302, NIH-7, CBG-130, CT head negative. TPA given at 2318. CTA-no LVO. Plan to admit.

## 2020-03-26 ENCOUNTER — Inpatient Hospital Stay (HOSPITAL_COMMUNITY): Payer: Federal, State, Local not specified - PPO

## 2020-03-26 DIAGNOSIS — I639 Cerebral infarction, unspecified: Secondary | ICD-10-CM

## 2020-03-26 DIAGNOSIS — I6389 Other cerebral infarction: Secondary | ICD-10-CM

## 2020-03-26 LAB — CBC
HCT: 32.2 % — ABNORMAL LOW (ref 39.0–52.0)
HCT: 34.5 % — ABNORMAL LOW (ref 39.0–52.0)
Hemoglobin: 9.4 g/dL — ABNORMAL LOW (ref 13.0–17.0)
Hemoglobin: 10.2 g/dL — ABNORMAL LOW (ref 13.0–17.0)
MCH: 23 pg — ABNORMAL LOW (ref 26.0–34.0)
MCH: 22.8 pg — ABNORMAL LOW (ref 26.0–34.0)
MCHC: 29.2 g/dL — ABNORMAL LOW (ref 30.0–36.0)
MCHC: 29.6 g/dL — ABNORMAL LOW (ref 30.0–36.0)
MCV: 78.7 fL — ABNORMAL LOW (ref 80.0–100.0)
MCV: 77 fL — ABNORMAL LOW (ref 80.0–100.0)
Platelets: 322 10*3/uL (ref 150–400)
Platelets: 357 10*3/uL (ref 150–400)
RBC: 4.09 MIL/uL — ABNORMAL LOW (ref 4.22–5.81)
RBC: 4.48 MIL/uL (ref 4.22–5.81)
RDW: 17.7 % — ABNORMAL HIGH (ref 11.5–15.5)
RDW: 17.9 % — ABNORMAL HIGH (ref 11.5–15.5)
WBC: 18.7 10*3/uL — ABNORMAL HIGH (ref 4.0–10.5)
WBC: 14.4 10*3/uL — ABNORMAL HIGH (ref 4.0–10.5)
nRBC: 0 % (ref 0.0–0.2)
nRBC: 0 % (ref 0.0–0.2)

## 2020-03-26 LAB — LIPID PANEL
Cholesterol: 273 mg/dL — ABNORMAL HIGH (ref 0–200)
HDL: 37 mg/dL — ABNORMAL LOW (ref >40)
LDL Cholesterol: UNDETERMINED mg/dL (ref 0–99)
Total CHOL/HDL Ratio: 7.4 RATIO
Triglycerides: 410 mg/dL — ABNORMAL HIGH (ref <150)
VLDL: UNDETERMINED mg/dL (ref 0–40)

## 2020-03-26 LAB — DIFFERENTIAL
Abs Immature Granulocytes: 0.17 10*3/uL — ABNORMAL HIGH (ref 0.00–0.07)
Basophils Absolute: 0 10*3/uL (ref 0.0–0.1)
Basophils Relative: 0 %
Eosinophils Absolute: 0 10*3/uL (ref 0.0–0.5)
Eosinophils Relative: 0 %
Immature Granulocytes: 1 %
Lymphocytes Relative: 5 %
Lymphs Abs: 0.9 10*3/uL (ref 0.7–4.0)
Monocytes Absolute: 1.5 10*3/uL — ABNORMAL HIGH (ref 0.1–1.0)
Monocytes Relative: 8 %
Neutro Abs: 16.1 10*3/uL — ABNORMAL HIGH (ref 1.7–7.7)
Neutrophils Relative %: 86 %

## 2020-03-26 LAB — PROTIME-INR
INR: >10 (ref 0.8–1.2)
INR: 1.4 — ABNORMAL HIGH (ref 0.8–1.2)
Prothrombin Time: >90 seconds — ABNORMAL HIGH (ref 11.4–15.2)
Prothrombin Time: 16.4 seconds — ABNORMAL HIGH (ref 11.4–15.2)

## 2020-03-26 LAB — HIV ANTIBODY (ROUTINE TESTING W REFLEX): HIV Screen 4th Generation wRfx: NONREACTIVE

## 2020-03-26 LAB — APTT
aPTT: 196 seconds (ref 24–36)
aPTT: 37 seconds — ABNORMAL HIGH (ref 24–36)

## 2020-03-26 LAB — COMPREHENSIVE METABOLIC PANEL
ALT: 11 U/L (ref 0–44)
AST: 16 U/L (ref 15–41)
Albumin: 3.8 g/dL (ref 3.5–5.0)
Alkaline Phosphatase: 49 U/L (ref 38–126)
Anion gap: 11 (ref 5–15)
BUN: 22 mg/dL — ABNORMAL HIGH (ref 6–20)
CO2: 22 mmol/L (ref 22–32)
Calcium: 8.4 mg/dL — ABNORMAL LOW (ref 8.9–10.3)
Chloride: 95 mmol/L — ABNORMAL LOW (ref 98–111)
Creatinine, Ser: 1.45 mg/dL — ABNORMAL HIGH (ref 0.61–1.24)
GFR calc Af Amer: >60 mL/min (ref >60)
GFR calc non Af Amer: 58 mL/min — ABNORMAL LOW (ref >60)
Glucose, Bld: 122 mg/dL — ABNORMAL HIGH (ref 70–99)
Potassium: 3.5 mmol/L (ref 3.5–5.1)
Sodium: 128 mmol/L — ABNORMAL LOW (ref 135–145)
Total Bilirubin: 0.5 mg/dL (ref 0.3–1.2)
Total Protein: 6.4 g/dL — ABNORMAL LOW (ref 6.5–8.1)

## 2020-03-26 LAB — ECHOCARDIOGRAM COMPLETE
Height: 68 in
Weight: 2726.65 oz

## 2020-03-26 LAB — PROCALCITONIN: Procalcitonin: <0.1 ng/mL

## 2020-03-26 LAB — HEMOGLOBIN A1C
Hgb A1c MFr Bld: 5.7 % — ABNORMAL HIGH (ref 4.8–5.6)
Mean Plasma Glucose: 116.89 mg/dL

## 2020-03-26 LAB — LDL CHOLESTEROL, DIRECT: Direct LDL: 122.2 mg/dL — ABNORMAL HIGH (ref 0–99)

## 2020-03-26 LAB — MRSA PCR SCREENING: MRSA by PCR: NEGATIVE

## 2020-03-26 LAB — SAVE SMEAR(SSMR), FOR PROVIDER SLIDE REVIEW

## 2020-03-26 MED ORDER — ATORVASTATIN CALCIUM 80 MG PO TABS
80.0000 mg | ORAL_TABLET | Freq: Every day | ORAL | Status: DC
  Administered 2020-03-26 – 2020-03-27 (×2): 80 mg via ORAL
  Filled 2020-03-26 (×2): qty 1

## 2020-03-26 MED ORDER — ASPIRIN EC 325 MG PO TBEC
325.0000 mg | DELAYED_RELEASE_TABLET | Freq: Every day | ORAL | Status: DC
  Administered 2020-03-26: 325 mg via ORAL
  Filled 2020-03-26: qty 1

## 2020-03-26 MED ORDER — CHLORHEXIDINE GLUCONATE CLOTH 2 % EX PADS
6.0000 | MEDICATED_PAD | Freq: Every day | CUTANEOUS | Status: DC
  Administered 2020-03-26: 6 via TOPICAL

## 2020-03-26 NOTE — Progress Notes (Signed)
  Echocardiogram 2D Echocardiogram has been performed.  Kyle Ware [name] 03/26/2020, 1:16 PM

## 2020-03-26 NOTE — Progress Notes (Signed)
Received diabetes coordinator consult. Noted that patient's hgbA1C is 5.7% on 03/26/20 which is on the lower scale of prediabetes. Recommend patient follow up with PCP to follow glucose control.   Smith Mince RN BSN CDE Diabetes Coordinator Pager: [phone]  8am-5pm

## 2020-03-26 NOTE — Evaluation (Signed)
Speech Language Pathology Evaluation Patient Details Name: Kyle Ware MRN: [id number] DOB: November 05, 1976 Today's Date: 03/26/2020 Time: 0102-7253 SLP Time Calculation (min) (ACUTE ONLY): 16 min  Problem List:  Patient Active Problem List   Diagnosis Date Noted  . 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 Asthma    Past Surgical History: History reviewed. No pertinent surgical history. HPI:  Pt is a 44 yo male presenting with L sided weakness and slurred speech s/p TPA. MRI pending. PMH includes: recent COVID (symptom onset 4/10), asthma   Assessment / Plan / Recommendation Clinical Impression  Pt's cognitive and communicative skills appear to be within normal limits across testing, and he and his wife both report that he is at his baseline. I questioned a subtle dysarthria, easily intelligible at the conversational level. They do acknowledge slurred speech at the time of admission, but say that this has fully resolved. SLP to sign off acutely.     SLP Assessment  SLP Recommendation/Assessment: Patient does not need any further Speech Lanaguage Pathology Services SLP Visit Diagnosis: Cognitive communication deficit (R41.841)    Follow Up Recommendations  None    Frequency and Duration           SLP Evaluation Cognition  Overall Cognitive Status: Within Functional Limits for tasks assessed Orientation Level: Oriented X4       Comprehension  Auditory Comprehension Overall Auditory Comprehension: Appears within functional limits for tasks assessed    Expression Expression Primary Mode of Expression: Verbal Verbal Expression Overall Verbal Expression: Appears within functional limits for tasks assessed   Oral / Motor  Motor Speech Overall Motor Speech: Appears within functional limits for tasks assessed   GO                     Mahala Menghini., M.A. CCC-SLP Acute Rehabilitation Services Pager  [phone] Office [phone]  03/26/2020, 10:02 AM

## 2020-03-26 NOTE — H&P (View-Only) (Signed)
STROKE TEAM PROGRESS NOTE   INTERVAL HISTORY His RN and wife are at the bedside. Stroke symptoms have much improved. Pt has not been to a doctor in years and takes no medications. I personally reviewed history of presenting illness with the patient in details, electronic medical records and imaging films in PACS. He received IV TPA uneventfully. Blood pressure adequately controlled. He states is significantly improved. Vital signs stable  Vitals:   03/26/20 1100 03/26/20 1200 03/26/20 1300 03/26/20 1400  BP: 117/80  116/77 125/83  Pulse: 75 75 72 78  Resp: 17   14  Temp:  98.6 F (37 C)    TempSrc:  Oral    SpO2: 97% 98% 98% 97%  Weight:      Height:        CBC:  Recent Labs  Lab 03/25/20 2304 03/25/20 2304 03/25/20 2310 03/26/20 0317  WBC 18.7*  --   --  14.4*  NEUTROABS 16.1*  --   --   --   HGB 9.4*   < > 12.9* 10.2*  HCT 32.2*   < > 38.0* 34.5*  MCV 78.7*  --   --  77.0*  PLT 322  --   --  357   < > = values in this interval not displayed.    Basic Metabolic Panel:  Recent Labs  Lab 03/25/20 2304 03/25/20 2310  NA 128* 134*  K 3.5 4.5  CL 95* 100  CO2 22  --   GLUCOSE 122* 128*  BUN 22* 31*  CREATININE 1.45* 1.40*  CALCIUM 8.4*  --    Lipid Panel:     Component Value Date/Time   CHOL 273 (H) 03/26/2020 0317   TRIG 410 (H) 03/26/2020 0317   HDL 37 (L) 03/26/2020 0317   CHOLHDL 7.4 03/26/2020 0317   VLDL UNABLE TO CALCULATE IF TRIGLYCERIDE OVER 400 mg/dL 16/08/9603 5409   LDLCALC UNABLE TO CALCULATE IF TRIGLYCERIDE OVER 400 mg/dL 81/19/1478 2956   OZHY8M:  Lab Results  Component Value Date   HGBA1C 5.7 (H) 03/26/2020   Urine Drug Screen: No results found for: LABOPIA, COCAINSCRNUR, LABBENZ, AMPHETMU, THCU, LABBARB  Alcohol Level No results found for: ETH  IMAGING past 24 hours CT Code Stroke CTA Head W/WO contrast  Result Date: 03/26/2020 CLINICAL DATA:  Initial evaluation for acute stroke, left-sided facial droop and weakness, abnormal gaze,  slurred speech. EXAM: CT ANGIOGRAPHY HEAD AND NECK TECHNIQUE: Multidetector CT imaging of the head and neck was performed using the standard protocol during bolus administration of intravenous contrast. Multiplanar CT image reconstructions and MIPs were obtained to evaluate the vascular anatomy. Carotid stenosis measurements (when applicable) are obtained utilizing NASCET criteria, using the distal internal carotid diameter as the denominator. CONTRAST:  27mL OMNIPAQUE IOHEXOL 350 MG/ML SOLN COMPARISON:  Prior head CT from earlier same day. FINDINGS: CTA NECK FINDINGS Aortic arch: Visualized aortic arch of normal caliber with normal 3 vessel morphology. No hemodynamically significant stenosis or other abnormality about the origin of the great vessels. Visualized subclavian arteries widely patent. Right carotid system: Right common carotid artery widely patent from its origin to the bifurcation without stenosis. Irregular soft plaque seen about the right bifurcation/proximal right ICA with associated stenosis of up to 50% by NASCET criteria. Right ICA otherwise widely patent distally to the skull base without stenosis, dissection or occlusion. Left carotid system: Left common and internal carotid arteries widely patent without stenosis, dissection or occlusion. No significant atheromatous narrowing about the left bifurcation. Vertebral arteries:  Both vertebral arteries arise from the subclavian arteries. Left vertebral artery dominant with a diffusely hypoplastic right vertebral artery. Vertebral arteries widely patent within the neck without stenosis, dissection, or occlusion. Skeleton: No acute osseous abnormality. No discrete or worrisome osseous lesions. Other neck: No other acute soft tissue abnormality within the neck. No mass lesion or adenopathy. Upper chest: Mild scattered tree in bud reticular densities seen within the peripheral right upper lobe, nonspecific, but suspicious for possible mild  bronchiolitis/pneumonitis. Visualized upper chest demonstrates no other acute finding. Review of the MIP images confirms the above findings CTA HEAD FINDINGS Anterior circulation: Both internal carotid arteries widely patent to the termini without stenosis or other abnormality. A1 segments widely patent. Normal anterior communicating artery. Anterior cerebral arteries widely patent to their distal aspects. No M1 stenosis or occlusion. Normal MCA bifurcations. Distal MCA branches well perfused and symmetric. Posterior circulation: Vertebral arteries widely patent to the vertebrobasilar junction without stenosis. Both picas patent. Basilar patent to its distal aspect without stenosis. Superior cerebral arteries patent bilaterally. Left PCA supplied via the basilar. Right PCA supplied via a hypoplastic right P1 segment and robust right posterior communicating artery. Both PCAs well perfused to their distal aspects without stenosis. Venous sinuses: Patent. Anatomic variants: Predominant fetal type origin of the right PCA. No aneurysm. Review of the MIP images confirms the above findings IMPRESSION: 1. Negative CTA for large vessel occlusion. 2. Atheromatous plaque about the right carotid bifurcation/proximal right ICA with associated stenosis of up to 50% by NASCET criteria. 3. Otherwise wide patency of the major arterial vasculature of the head and neck. No other hemodynamically significant or correctable stenosis. No other significant atheromatous disease for age. 4. Mild tree-in-bud reticular densities within the peripheral right upper lobe, nonspecific, but suspicious for mild pneumonitis/small airways disease. These results were communicated to Dr. Amada Jupiter at 11:47 pmon 4/28/2021by text page via the [facility] messaging system. Electronically Signed   By: Rise Mu M.D.   On: 03/26/2020 00:03   CT Code Stroke CTA Neck W/WO contrast  Result Date: 03/26/2020 CLINICAL DATA:  Initial evaluation for acute  stroke, left-sided facial droop and weakness, abnormal gaze, slurred speech. EXAM: CT ANGIOGRAPHY HEAD AND NECK TECHNIQUE: Multidetector CT imaging of the head and neck was performed using the standard protocol during bolus administration of intravenous contrast. Multiplanar CT image reconstructions and MIPs were obtained to evaluate the vascular anatomy. Carotid stenosis measurements (when applicable) are obtained utilizing NASCET criteria, using the distal internal carotid diameter as the denominator. CONTRAST:  46mL OMNIPAQUE IOHEXOL 350 MG/ML SOLN COMPARISON:  Prior head CT from earlier same day. FINDINGS: CTA NECK FINDINGS Aortic arch: Visualized aortic arch of normal caliber with normal 3 vessel morphology. No hemodynamically significant stenosis or other abnormality about the origin of the great vessels. Visualized subclavian arteries widely patent. Right carotid system: Right common carotid artery widely patent from its origin to the bifurcation without stenosis. Irregular soft plaque seen about the right bifurcation/proximal right ICA with associated stenosis of up to 50% by NASCET criteria. Right ICA otherwise widely patent distally to the skull base without stenosis, dissection or occlusion. Left carotid system: Left common and internal carotid arteries widely patent without stenosis, dissection or occlusion. No significant atheromatous narrowing about the left bifurcation. Vertebral arteries: Both vertebral arteries arise from the subclavian arteries. Left vertebral artery dominant with a diffusely hypoplastic right vertebral artery. Vertebral arteries widely patent within the neck without stenosis, dissection, or occlusion. Skeleton: No acute osseous abnormality. No discrete or  worrisome osseous lesions. Other neck: No other acute soft tissue abnormality within the neck. No mass lesion or adenopathy. Upper chest: Mild scattered tree in bud reticular densities seen within the peripheral right upper lobe,  nonspecific, but suspicious for possible mild bronchiolitis/pneumonitis. Visualized upper chest demonstrates no other acute finding. Review of the MIP images confirms the above findings CTA HEAD FINDINGS Anterior circulation: Both internal carotid arteries widely patent to the termini without stenosis or other abnormality. A1 segments widely patent. Normal anterior communicating artery. Anterior cerebral arteries widely patent to their distal aspects. No M1 stenosis or occlusion. Normal MCA bifurcations. Distal MCA branches well perfused and symmetric. Posterior circulation: Vertebral arteries widely patent to the vertebrobasilar junction without stenosis. Both picas patent. Basilar patent to its distal aspect without stenosis. Superior cerebral arteries patent bilaterally. Left PCA supplied via the basilar. Right PCA supplied via a hypoplastic right P1 segment and robust right posterior communicating artery. Both PCAs well perfused to their distal aspects without stenosis. Venous sinuses: Patent. Anatomic variants: Predominant fetal type origin of the right PCA. No aneurysm. Review of the MIP images confirms the above findings IMPRESSION: 1. Negative CTA for large vessel occlusion. 2. Atheromatous plaque about the right carotid bifurcation/proximal right ICA with associated stenosis of up to 50% by NASCET criteria. 3. Otherwise wide patency of the major arterial vasculature of the head and neck. No other hemodynamically significant or correctable stenosis. No other significant atheromatous disease for age. 4. Mild tree-in-bud reticular densities within the peripheral right upper lobe, nonspecific, but suspicious for mild pneumonitis/small airways disease. These results were communicated to Dr. Kirkpatrick at 11:47 pmon 4/28/2021by text page via the [employer] messaging system. Electronically Signed   By: Benjamin  McClintock M.D.   On: 03/26/2020 00:03   VAS US TRANSCRANIAL DOPPLER W BUBBLES  Result Date:  03/26/2020  Transcranial Doppler with Bubble Indications: Stroke. Performing Technologist: Jill Parker RDMS, RVT  Examination Guidelines: A complete evaluation includes B-mode imaging, spectral Doppler, color Doppler, and power Doppler as needed of all accessible portions of each vessel. Bilateral testing is considered an integral part of a complete examination. Limited examinations for reoccurring indications may be performed as noted.  Summary:  A vascular evaluation was performed. The right middle cerebral artery was studied. An IV was inserted into the patient's left AC fossa. Verbal informed consent was obtained.  HITS at rest: mild HITS during Valsalva: moderate, partial curtain sign Evidence of medium size PFO *See table(s) above for TCD measurements and observations.    Preliminary    CT HEAD CODE STROKE WO CONTRAST  Result Date: 03/25/2020 CLINICAL DATA:  Code stroke. Initial evaluation for acute left-sided facial droop, weakness, slurred speech, abnormal gaze., on EXAM: CT HEAD WITHOUT CONTRAST TECHNIQUE: Contiguous axial images were obtained from the base of the skull through the vertex without intravenous contrast. COMPARISON:  None available. FINDINGS: Brain: Cerebral volume within normal limits for patient age. No evidence for acute intracranial hemorrhage. No findings to suggest acute large vessel territory infarct. No mass lesion, midline shift, or mass effect. Ventricles are normal in size without evidence for hydrocephalus. No extra-axial fluid collection identified. Vascular: No hyperdense vessel identified. Skull: Scalp soft tissues demonstrate no acute abnormality. Calvarium intact. Sinuses/Orbits: Globes and orbital soft tissues within normal limits. Visualized paranasal sinuses are clear. No mastoid effusion. ASPECTS ([place] Stroke Program Early CT Score) - Ganglionic level infarction (caudate, lentiform nuclei, internal capsule, insula, M1-M3 cortex): 7 - Supraganglionic infarction  (M4-M6 cortex): 3 Total score (0-10 with   10 being normal): 10 IMPRESSION: 1. Negative head CT.  No acute intracranial abnormality identified. 2. ASPECTS is 10. Critical Value/emergent results were called by telephone at the time of interpretation on 03/25/2020 at 11:18 pm to provider Dr. Leonel Ramsay, Who verbally acknowledged these results. Electronically Signed   By: Jeannine Boga M.D.   On: 03/25/2020 23:24   VAS [place] LOWER EXTREMITY VENOUS (DVT)  Result Date: 03/26/2020  Lower Venous DVTStudy Indications: Stroke.  Performing Technologist: June Leap RDMS, RVT  Examination Guidelines: A complete evaluation includes B-mode imaging, spectral Doppler, color Doppler, and power Doppler as needed of all accessible portions of each vessel. Bilateral testing is considered an integral part of a complete examination. Limited examinations for reoccurring indications may be performed as noted. The reflux portion of the exam is performed with the patient in reverse Trendelenburg.  +---------+---------------+---------+-----------+----------+--------------+ RIGHT    CompressibilityPhasicitySpontaneityPropertiesThrombus Aging +---------+---------------+---------+-----------+----------+--------------+ CFV      Full           Yes      Yes                                 +---------+---------------+---------+-----------+----------+--------------+ SFJ      Full                                                        +---------+---------------+---------+-----------+----------+--------------+ FV Prox  Full                                                        +---------+---------------+---------+-----------+----------+--------------+ FV Mid   Full                                                        +---------+---------------+---------+-----------+----------+--------------+ FV DistalFull                                                         +---------+---------------+---------+-----------+----------+--------------+ PFV      Full                                                        +---------+---------------+---------+-----------+----------+--------------+ POP      Full           Yes      Yes                                 +---------+---------------+---------+-----------+----------+--------------+ PTV      Full                                                        +---------+---------------+---------+-----------+----------+--------------+  PERO     Full                                                        +---------+---------------+---------+-----------+----------+--------------+   +---------+---------------+---------+-----------+----------+--------------+ LEFT     CompressibilityPhasicitySpontaneityPropertiesThrombus Aging +---------+---------------+---------+-----------+----------+--------------+ CFV      Full           Yes      Yes                                 +---------+---------------+---------+-----------+----------+--------------+ SFJ      Full                                                        +---------+---------------+---------+-----------+----------+--------------+ FV Prox  Full                                                        +---------+---------------+---------+-----------+----------+--------------+ FV Mid   Full                                                        +---------+---------------+---------+-----------+----------+--------------+ FV DistalFull                                                        +---------+---------------+---------+-----------+----------+--------------+ PFV      Full                                                        +---------+---------------+---------+-----------+----------+--------------+ POP      Full           Yes      Yes                                  +---------+---------------+---------+-----------+----------+--------------+ PTV      Full                                                        +---------+---------------+---------+-----------+----------+--------------+ PERO     Full                                                        +---------+---------------+---------+-----------+----------+--------------+  Summary: RIGHT: - There is no evidence of deep vein thrombosis in the lower extremity.  - No cystic structure found in the popliteal fossa.  LEFT: - There is no evidence of deep vein thrombosis in the lower extremity.  - No cystic structure found in the popliteal fossa.  *See table(s) above for measurements and observations.    Preliminary     PHYSICAL EXAM Constitutional: Appears well-developed and well-nourished middle-aged Caucasian male.  Eyes: No scleral injection HENT: No OP obstrucion Head: normocephalic Cardiovascular: Normal rate and regular rhythm.  Respiratory: Effort normal and breath sounds normal to anterior ascultation GI: Soft.  No distension. There is no tenderness.  Skin: WDI  Neuro: Mental Status: Patient is awake, alert, oriented to person, place, month, year, and situation. Patient is able to give a clear and coherent history. No signs of aphasia or neglect Cranial Nerves: II: He has a left lower quadrantanopia pupils are equal, round, and reactive to light.   III,IV, VI: EOMI without ptosis or diploplia.  V: Facial sensation is symmetric to temperature VII: Facial movement symmetric, there is a very slight flattening of left nasolabial fold. VIII: hearing is intact to voice X: Uvula elevates symmetrically XI: Shoulder shrug is symmetric. XII: tongue is midline without atrophy or fasciculations.  Motor: Tone is normal. Bulk is normal. 5/5 strength was present on the right side, on the left he has 5/5 now w/very mild pronator drift of the left arm and 5/5 the left leg. Weakness of left grip  and intrinsic hand muscles. Orbits right over left upper extremity. Trace weakness of left hip flexors and ankle dorsiflexors. Sensory: Sensation is now intact to LT throughout Cerebellar: He has ataxia in the left arm, proportionate to weakness in the left leg  NIHSS 3   premorbid modified Rankin 0  ASSESSMENT/PLAN Kyle Ware is a 44 y.o. male with no PMH since he does not seek medical care, presenting with acute left-sided weakness and was treated with IV tPA.  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 Wk up underway for better etiology  Code Stroke CT head No acute abnormality. ASPECTS 10.     CTA head & neck neg  MRI  pending  2D Echo pending  LDL UNABLE TO CALCULATE IF TRIGLYCERIDE OVER 400 mg/dL  ENID7O 5.7  SCDs for VTE prophylaxis    Diet   Diet regular Room service appropriate? Yes; Fluid consistency: Thin     none prior to admission, now on none till 24h post tPA  Therapy recommendations:  pending  Disposition:  pending  Hypertension  Home meds:  none  Stable . Permissive hypertension (OK if < 180/105) but gradually normalize in 5-7 days . Long-term BP goal normotensive  Hyperlipidemia  Home meds:  none  LDL UNABLE TO CALCULATE IF TRIGLYCERIDE OVER 400 mg/dL, goal < 70  Add Lipitor 80 mg  Continue statin at discharge  No dx of Diabetes type II    HgbA1c 5.7, goal < 7.0  CBGs Recent Labs    03/25/20 2306  GLUCAP 130*      SSI  Other Stroke Risk Factors  Recent COVID 19 infection  Tobacco smoker; counseled on quitting  Hospital day # 1  Desiree Metzger-Cihelka, ARNP-C, ANVP-BC Pager: [phone]  I have personally obtained history,examined this patient, reviewed notes, independently viewed imaging studies, participated in medical decision making and plan of care.ROS completed by me personally and pertinent positives fully documented  I have made  any additions or  clarifications directly to the above note. Agree with note above. Patient presented with sudden onset left hemiplegia and received IV TPA and seems to have shown substantial clinical improvement. Continue close neurological monitoring and strict blood pressure control as per post TPA protocol. Check MRI scan of the brain later today. Continue ongoing stroke work-up. Check transcranial Doppler bubble study for PFO given his young age and lower extremity venous Dopplers for DVT given recent history of Covid infection which may make him hypercoagulable. Mobilize out of bed. Therapy consults. Start aspirin after MRI if no bleed. Long discussion with patient and wife at the bedside and answered questions. This patient is critically ill and at significant risk of neurological worsening, death and care requires constant monitoring of vital signs, hemodynamics,respiratory and cardiac monitoring, extensive review of multiple databases, frequent neurological assessment, discussion with family, other specialists and medical decision making of high complexity.I have made any additions or clarifications directly to the above note.This critical care time does not reflect procedure time, or teaching time or supervisory time of PA/NP/Med Resident etc but could involve care discussion time.  I spent 30 minutes of neurocritical care time  in the care of  this patient.     Delia HeadyPramod Shawnie Nicole, MD Medical Director [facility] Pager: [phone] 03/26/2020 5:01 PM  To contact Stroke Continuity provider, please refer to [email or web address]. After hours, contact General Neurology

## 2020-03-26 NOTE — Progress Notes (Addendum)
STROKE TEAM PROGRESS NOTE   INTERVAL HISTORY His RN and wife are at the bedside. Stroke symptoms have much improved. Pt has not been to a doctor in years and takes no medications. I personally reviewed history of presenting illness with the patient in details, electronic medical records and imaging films in PACS. He received IV TPA uneventfully. Blood pressure adequately controlled. He states is significantly improved. Vital signs stable  Vitals:   03/26/20 1100 03/26/20 1200 03/26/20 1300 03/26/20 1400  BP: 117/80  116/77 125/83  Pulse: 75 75 72 78  Resp: 17   14  Temp:  98.6 F (37 C)    TempSrc:  Oral    SpO2: 97% 98% 98% 97%  Weight:      Height:        CBC:  Recent Labs  Lab 03/25/20 2304 03/25/20 2304 03/25/20 2310 03/26/20 0317  WBC 18.7*  --   --  14.4*  NEUTROABS 16.1*  --   --   --   HGB 9.4*   < > 12.9* 10.2*  HCT 32.2*   < > 38.0* 34.5*  MCV 78.7*  --   --  77.0*  PLT 322  --   --  357   < > = values in this interval not displayed.    Basic Metabolic Panel:  Recent Labs  Lab 03/25/20 2304 03/25/20 2310  NA 128* 134*  K 3.5 4.5  CL 95* 100  CO2 22  --   GLUCOSE 122* 128*  BUN 22* 31*  CREATININE 1.45* 1.40*  CALCIUM 8.4*  --    Lipid Panel:     Component Value Date/Time   CHOL 273 (H) 03/26/2020 0317   TRIG 410 (H) 03/26/2020 0317   HDL 37 (L) 03/26/2020 0317   CHOLHDL 7.4 03/26/2020 0317   VLDL UNABLE TO CALCULATE IF TRIGLYCERIDE OVER 400 mg/dL 16/08/9603 5409   LDLCALC UNABLE TO CALCULATE IF TRIGLYCERIDE OVER 400 mg/dL 81/19/1478 2956   OZHY8M:  Lab Results  Component Value Date   HGBA1C 5.7 (H) 03/26/2020   Urine Drug Screen: No results found for: LABOPIA, COCAINSCRNUR, LABBENZ, AMPHETMU, THCU, LABBARB  Alcohol Level No results found for: ETH  IMAGING past 24 hours CT Code Stroke CTA Head W/WO contrast  Result Date: 03/26/2020 CLINICAL DATA:  Initial evaluation for acute stroke, left-sided facial droop and weakness, abnormal gaze,  slurred speech. EXAM: CT ANGIOGRAPHY HEAD AND NECK TECHNIQUE: Multidetector CT imaging of the head and neck was performed using the standard protocol during bolus administration of intravenous contrast. Multiplanar CT image reconstructions and MIPs were obtained to evaluate the vascular anatomy. Carotid stenosis measurements (when applicable) are obtained utilizing NASCET criteria, using the distal internal carotid diameter as the denominator. CONTRAST:  27mL OMNIPAQUE IOHEXOL 350 MG/ML SOLN COMPARISON:  Prior head CT from earlier same day. FINDINGS: CTA NECK FINDINGS Aortic arch: Visualized aortic arch of normal caliber with normal 3 vessel morphology. No hemodynamically significant stenosis or other abnormality about the origin of the great vessels. Visualized subclavian arteries widely patent. Right carotid system: Right common carotid artery widely patent from its origin to the bifurcation without stenosis. Irregular soft plaque seen about the right bifurcation/proximal right ICA with associated stenosis of up to 50% by NASCET criteria. Right ICA otherwise widely patent distally to the skull base without stenosis, dissection or occlusion. Left carotid system: Left common and internal carotid arteries widely patent without stenosis, dissection or occlusion. No significant atheromatous narrowing about the left bifurcation. Vertebral arteries:  Both vertebral arteries arise from the subclavian arteries. Left vertebral artery dominant with a diffusely hypoplastic right vertebral artery. Vertebral arteries widely patent within the neck without stenosis, dissection, or occlusion. Skeleton: No acute osseous abnormality. No discrete or worrisome osseous lesions. Other neck: No other acute soft tissue abnormality within the neck. No mass lesion or adenopathy. Upper chest: Mild scattered tree in bud reticular densities seen within the peripheral right upper lobe, nonspecific, but suspicious for possible mild  bronchiolitis/pneumonitis. Visualized upper chest demonstrates no other acute finding. Review of the MIP images confirms the above findings CTA HEAD FINDINGS Anterior circulation: Both internal carotid arteries widely patent to the termini without stenosis or other abnormality. A1 segments widely patent. Normal anterior communicating artery. Anterior cerebral arteries widely patent to their distal aspects. No M1 stenosis or occlusion. Normal MCA bifurcations. Distal MCA branches well perfused and symmetric. Posterior circulation: Vertebral arteries widely patent to the vertebrobasilar junction without stenosis. Both picas patent. Basilar patent to its distal aspect without stenosis. Superior cerebral arteries patent bilaterally. Left PCA supplied via the basilar. Right PCA supplied via a hypoplastic right P1 segment and robust right posterior communicating artery. Both PCAs well perfused to their distal aspects without stenosis. Venous sinuses: Patent. Anatomic variants: Predominant fetal type origin of the right PCA. No aneurysm. Review of the MIP images confirms the above findings IMPRESSION: 1. Negative CTA for large vessel occlusion. 2. Atheromatous plaque about the right carotid bifurcation/proximal right ICA with associated stenosis of up to 50% by NASCET criteria. 3. Otherwise wide patency of the major arterial vasculature of the head and neck. No other hemodynamically significant or correctable stenosis. No other significant atheromatous disease for age. 4. Mild tree-in-bud reticular densities within the peripheral right upper lobe, nonspecific, but suspicious for mild pneumonitis/small airways disease. These results were communicated to Dr. Amada Jupiter at 11:47 pmon 4/28/2021by text page via the [facility] messaging system. Electronically Signed   By: Rise Mu M.D.   On: 03/26/2020 00:03   CT Code Stroke CTA Neck W/WO contrast  Result Date: 03/26/2020 CLINICAL DATA:  Initial evaluation for acute  stroke, left-sided facial droop and weakness, abnormal gaze, slurred speech. EXAM: CT ANGIOGRAPHY HEAD AND NECK TECHNIQUE: Multidetector CT imaging of the head and neck was performed using the standard protocol during bolus administration of intravenous contrast. Multiplanar CT image reconstructions and MIPs were obtained to evaluate the vascular anatomy. Carotid stenosis measurements (when applicable) are obtained utilizing NASCET criteria, using the distal internal carotid diameter as the denominator. CONTRAST:  46mL OMNIPAQUE IOHEXOL 350 MG/ML SOLN COMPARISON:  Prior head CT from earlier same day. FINDINGS: CTA NECK FINDINGS Aortic arch: Visualized aortic arch of normal caliber with normal 3 vessel morphology. No hemodynamically significant stenosis or other abnormality about the origin of the great vessels. Visualized subclavian arteries widely patent. Right carotid system: Right common carotid artery widely patent from its origin to the bifurcation without stenosis. Irregular soft plaque seen about the right bifurcation/proximal right ICA with associated stenosis of up to 50% by NASCET criteria. Right ICA otherwise widely patent distally to the skull base without stenosis, dissection or occlusion. Left carotid system: Left common and internal carotid arteries widely patent without stenosis, dissection or occlusion. No significant atheromatous narrowing about the left bifurcation. Vertebral arteries: Both vertebral arteries arise from the subclavian arteries. Left vertebral artery dominant with a diffusely hypoplastic right vertebral artery. Vertebral arteries widely patent within the neck without stenosis, dissection, or occlusion. Skeleton: No acute osseous abnormality. No discrete or  worrisome osseous lesions. Other neck: No other acute soft tissue abnormality within the neck. No mass lesion or adenopathy. Upper chest: Mild scattered tree in bud reticular densities seen within the peripheral right upper lobe,  nonspecific, but suspicious for possible mild bronchiolitis/pneumonitis. Visualized upper chest demonstrates no other acute finding. Review of the MIP images confirms the above findings CTA HEAD FINDINGS Anterior circulation: Both internal carotid arteries widely patent to the termini without stenosis or other abnormality. A1 segments widely patent. Normal anterior communicating artery. Anterior cerebral arteries widely patent to their distal aspects. No M1 stenosis or occlusion. Normal MCA bifurcations. Distal MCA branches well perfused and symmetric. Posterior circulation: Vertebral arteries widely patent to the vertebrobasilar junction without stenosis. Both picas patent. Basilar patent to its distal aspect without stenosis. Superior cerebral arteries patent bilaterally. Left PCA supplied via the basilar. Right PCA supplied via a hypoplastic right P1 segment and robust right posterior communicating artery. Both PCAs well perfused to their distal aspects without stenosis. Venous sinuses: Patent. Anatomic variants: Predominant fetal type origin of the right PCA. No aneurysm. Review of the MIP images confirms the above findings IMPRESSION: 1. Negative CTA for large vessel occlusion. 2. Atheromatous plaque about the right carotid bifurcation/proximal right ICA with associated stenosis of up to 50% by NASCET criteria. 3. Otherwise wide patency of the major arterial vasculature of the head and neck. No other hemodynamically significant or correctable stenosis. No other significant atheromatous disease for age. 4. Mild tree-in-bud reticular densities within the peripheral right upper lobe, nonspecific, but suspicious for mild pneumonitis/small airways disease. These results were communicated to Dr. Amada Jupiter at 11:47 pmon 4/28/2021by text page via the [facility] messaging system. Electronically Signed   By: Rise Mu M.D.   On: 03/26/2020 00:03   VAS [place] TRANSCRANIAL DOPPLER W BUBBLES  Result Date:  03/26/2020  Transcranial Doppler with Bubble Indications: Stroke. Performing Technologist: Jeb Levering RDMS, RVT  Examination Guidelines: A complete evaluation includes B-mode imaging, spectral Doppler, color Doppler, and power Doppler as needed of all accessible portions of each vessel. Bilateral testing is considered an integral part of a complete examination. Limited examinations for reoccurring indications may be performed as noted.  Summary:  A vascular evaluation was performed. The right middle cerebral artery was studied. An IV was inserted into the patient's left AC fossa. Verbal informed consent was obtained.  HITS at rest: mild HITS during Valsalva: moderate, partial curtain sign Evidence of medium size PFO *See table(s) above for TCD measurements and observations.    Preliminary    CT HEAD CODE STROKE WO CONTRAST  Result Date: 03/25/2020 CLINICAL DATA:  Code stroke. Initial evaluation for acute left-sided facial droop, weakness, slurred speech, abnormal gaze., on EXAM: CT HEAD WITHOUT CONTRAST TECHNIQUE: Contiguous axial images were obtained from the base of the skull through the vertex without intravenous contrast. COMPARISON:  None available. FINDINGS: Brain: Cerebral volume within normal limits for patient age. No evidence for acute intracranial hemorrhage. No findings to suggest acute large vessel territory infarct. No mass lesion, midline shift, or mass effect. Ventricles are normal in size without evidence for hydrocephalus. No extra-axial fluid collection identified. Vascular: No hyperdense vessel identified. Skull: Scalp soft tissues demonstrate no acute abnormality. Calvarium intact. Sinuses/Orbits: Globes and orbital soft tissues within normal limits. Visualized paranasal sinuses are clear. No mastoid effusion. ASPECTS [facility] Stroke Program Early CT Score) - Ganglionic level infarction (caudate, lentiform nuclei, internal capsule, insula, M1-M3 cortex): 7 - Supraganglionic infarction  (M4-M6 cortex): 3 Total score (0-10 with  10 being normal): 10 IMPRESSION: 1. Negative head CT.  No acute intracranial abnormality identified. 2. ASPECTS is 10. Critical Value/emergent results were called by telephone at the time of interpretation on 03/25/2020 at 11:18 pm to provider Dr. Leonel Ramsay, Who verbally acknowledged these results. Electronically Signed   By: Jeannine Boga M.D.   On: 03/25/2020 23:24   VAS [place] LOWER EXTREMITY VENOUS (DVT)  Result Date: 03/26/2020  Lower Venous DVTStudy Indications: Stroke.  Performing Technologist: June Leap RDMS, RVT  Examination Guidelines: A complete evaluation includes B-mode imaging, spectral Doppler, color Doppler, and power Doppler as needed of all accessible portions of each vessel. Bilateral testing is considered an integral part of a complete examination. Limited examinations for reoccurring indications may be performed as noted. The reflux portion of the exam is performed with the patient in reverse Trendelenburg.  +---------+---------------+---------+-----------+----------+--------------+ RIGHT    CompressibilityPhasicitySpontaneityPropertiesThrombus Aging +---------+---------------+---------+-----------+----------+--------------+ CFV      Full           Yes      Yes                                 +---------+---------------+---------+-----------+----------+--------------+ SFJ      Full                                                        +---------+---------------+---------+-----------+----------+--------------+ FV Prox  Full                                                        +---------+---------------+---------+-----------+----------+--------------+ FV Mid   Full                                                        +---------+---------------+---------+-----------+----------+--------------+ FV DistalFull                                                         +---------+---------------+---------+-----------+----------+--------------+ PFV      Full                                                        +---------+---------------+---------+-----------+----------+--------------+ POP      Full           Yes      Yes                                 +---------+---------------+---------+-----------+----------+--------------+ PTV      Full                                                        +---------+---------------+---------+-----------+----------+--------------+  PERO     Full                                                        +---------+---------------+---------+-----------+----------+--------------+   +---------+---------------+---------+-----------+----------+--------------+ LEFT     CompressibilityPhasicitySpontaneityPropertiesThrombus Aging +---------+---------------+---------+-----------+----------+--------------+ CFV      Full           Yes      Yes                                 +---------+---------------+---------+-----------+----------+--------------+ SFJ      Full                                                        +---------+---------------+---------+-----------+----------+--------------+ FV Prox  Full                                                        +---------+---------------+---------+-----------+----------+--------------+ FV Mid   Full                                                        +---------+---------------+---------+-----------+----------+--------------+ FV DistalFull                                                        +---------+---------------+---------+-----------+----------+--------------+ PFV      Full                                                        +---------+---------------+---------+-----------+----------+--------------+ POP      Full           Yes      Yes                                  +---------+---------------+---------+-----------+----------+--------------+ PTV      Full                                                        +---------+---------------+---------+-----------+----------+--------------+ PERO     Full                                                        +---------+---------------+---------+-----------+----------+--------------+  Summary: RIGHT: - There is no evidence of deep vein thrombosis in the lower extremity.  - No cystic structure found in the popliteal fossa.  LEFT: - There is no evidence of deep vein thrombosis in the lower extremity.  - No cystic structure found in the popliteal fossa.  *See table(s) above for measurements and observations.    Preliminary     PHYSICAL EXAM Constitutional: Appears well-developed and well-nourished middle-aged Caucasian male.  Eyes: No scleral injection HENT: No OP obstrucion Head: normocephalic Cardiovascular: Normal rate and regular rhythm.  Respiratory: Effort normal and breath sounds normal to anterior ascultation GI: Soft.  No distension. There is no tenderness.  Skin: WDI  Neuro: Mental Status: Patient is awake, alert, oriented to person, place, month, year, and situation. Patient is able to give a clear and coherent history. No signs of aphasia or neglect Cranial Nerves: II: He has a left lower quadrantanopia pupils are equal, round, and reactive to light.   III,IV, VI: EOMI without ptosis or diploplia.  V: Facial sensation is symmetric to temperature VII: Facial movement symmetric, there is a very slight flattening of left nasolabial fold. VIII: hearing is intact to voice X: Uvula elevates symmetrically XI: Shoulder shrug is symmetric. XII: tongue is midline without atrophy or fasciculations.  Motor: Tone is normal. Bulk is normal. 5/5 strength was present on the right side, on the left he has 5/5 now w/very mild pronator drift of the left arm and 5/5 the left leg. Weakness of left grip  and intrinsic hand muscles. Orbits right over left upper extremity. Trace weakness of left hip flexors and ankle dorsiflexors. Sensory: Sensation is now intact to LT throughout Cerebellar: He has ataxia in the left arm, proportionate to weakness in the left leg  NIHSS 3   premorbid modified Rankin 0  ASSESSMENT/PLAN Mr. Kyle Ware is a 44 y.o. male with no PMH since he does not seek medical care, presenting with acute left-sided weakness and was treated with IV tPA.  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 Wk up underway for better etiology  Code Stroke CT head No acute abnormality. ASPECTS 10.     CTA head & neck neg  MRI  pending  2D Echo pending  LDL UNABLE TO CALCULATE IF TRIGLYCERIDE OVER 400 mg/dL  ENID7O 5.7  SCDs for VTE prophylaxis    Diet   Diet regular Room service appropriate? Yes; Fluid consistency: Thin     none prior to admission, now on none till 24h post tPA  Therapy recommendations:  pending  Disposition:  pending  Hypertension  Home meds:  none  Stable . Permissive hypertension (OK if < 180/105) but gradually normalize in 5-7 days . Long-term BP goal normotensive  Hyperlipidemia  Home meds:  none  LDL UNABLE TO CALCULATE IF TRIGLYCERIDE OVER 400 mg/dL, goal < 70  Add Lipitor 80 mg  Continue statin at discharge  No dx of Diabetes type II    HgbA1c 5.7, goal < 7.0  CBGs Recent Labs    03/25/20 2306  GLUCAP 130*      SSI  Other Stroke Risk Factors  Recent COVID 19 infection  Tobacco smoker; counseled on quitting  Hospital day # 1  Desiree Metzger-Cihelka, ARNP-C, ANVP-BC Pager: [phone]  I have personally obtained history,examined this patient, reviewed notes, independently viewed imaging studies, participated in medical decision making and plan of care.ROS completed by me personally and pertinent positives fully documented  I have made  any additions or  clarifications directly to the above note. Agree with note above. Patient presented with sudden onset left hemiplegia and received IV TPA and seems to have shown substantial clinical improvement. Continue close neurological monitoring and strict blood pressure control as per post TPA protocol. Check MRI scan of the brain later today. Continue ongoing stroke work-up. Check transcranial Doppler bubble study for PFO given his young age and lower extremity venous Dopplers for DVT given recent history of Covid infection which may make him hypercoagulable. Mobilize out of bed. Therapy consults. Start aspirin after MRI if no bleed. Long discussion with patient and wife at the bedside and answered questions. This patient is critically ill and at significant risk of neurological worsening, death and care requires constant monitoring of vital signs, hemodynamics,respiratory and cardiac monitoring, extensive review of multiple databases, frequent neurological assessment, discussion with family, other specialists and medical decision making of high complexity.I have made any additions or clarifications directly to the above note.This critical care time does not reflect procedure time, or teaching time or supervisory time of PA/NP/Med Resident etc but could involve care discussion time.  I spent 30 minutes of neurocritical care time  in the care of  this patient.     Delia HeadyPramod Mckay Brandt, MD Medical Director [facility] Pager: [phone] 03/26/2020 5:01 PM  To contact Stroke Continuity provider, please refer to [email or web address]. After hours, contact General Neurology

## 2020-03-26 NOTE — Plan of Care (Signed)
  Problem: Education: Goal: Knowledge of disease or condition will improve 03/26/2020 0923 by Milta Deiters, RN Outcome: Progressing 03/26/2020 0923 by Milta Deiters, RN Outcome: Progressing Goal: Knowledge of secondary prevention will improve 03/26/2020 0923 by Milta Deiters, RN Outcome: Progressing 03/26/2020 0923 by Milta Deiters, RN Outcome: Progressing Goal: Knowledge of patient specific risk factors addressed and post discharge goals established will improve 03/26/2020 0923 by Milta Deiters, RN Outcome: Progressing 03/26/2020 0923 by Milta Deiters, RN Outcome: Progressing Goal: Individualized Educational Video(s) 03/26/2020 0923 by Milta Deiters, RN Outcome: Progressing 03/26/2020 0923 by Milta Deiters, RN Outcome: Progressing   Problem: Coping: Goal: Will verbalize positive feelings about self 03/26/2020 0923 by Milta Deiters, RN Outcome: Progressing 03/26/2020 0923 by Milta Deiters, RN Outcome: Progressing Goal: Will identify appropriate support needs 03/26/2020 0923 by Milta Deiters, RN Outcome: Progressing 03/26/2020 0923 by Milta Deiters, RN Outcome: Progressing   Problem: Health Behavior/Discharge Planning: Goal: Ability to manage health-related needs will improve 03/26/2020 0923 by Milta Deiters, RN Outcome: Progressing 03/26/2020 0923 by Milta Deiters, RN Outcome: Progressing   Problem: Self-Care: Goal: Ability to participate in self-care as condition permits will improve 03/26/2020 0923 by Milta Deiters, RN Outcome: Progressing 03/26/2020 0923 by Milta Deiters, RN Outcome: Progressing Goal: Verbalization of feelings and concerns over difficulty with self-care will improve 03/26/2020 0923 by Milta Deiters, RN Outcome: Progressing 03/26/2020 0923 by Milta Deiters, RN Outcome: Progressing Goal: Ability to communicate needs accurately will improve 03/26/2020 0923 by Milta Deiters, RN Outcome: Progressing 03/26/2020 0923 by Milta Deiters, RN Outcome: Progressing    Problem: Nutrition: Goal: Risk of aspiration will decrease 03/26/2020 0923 by Milta Deiters, RN Outcome: Progressing 03/26/2020 0923 by Milta Deiters, RN Outcome: Progressing Goal: Dietary intake will improve 03/26/2020 0923 by Milta Deiters, RN Outcome: Progressing 03/26/2020 0923 by Milta Deiters, RN Outcome: Progressing   Problem: Ischemic Stroke/TIA Tissue Perfusion: Goal: Complications of ischemic stroke/TIA will be minimized 03/26/2020 0923 by Milta Deiters, RN Outcome: Progressing 03/26/2020 0923 by Milta Deiters, RN Outcome: Progressing

## 2020-03-26 NOTE — Progress Notes (Signed)
   [facility] has been requested to perform a transesophageal echocardiogram on Kyle Ware for stroke.  After careful review of history and examination, the risks and benefits of transesophageal echocardiogram have been explained including risks of esophageal damage, perforation (1:10,000 risk), bleeding, pharyngeal hematoma as well as other potential complications associated with conscious sedation including aspiration, arrhythmia, respiratory failure and death. Alternatives to treatment were discussed, questions were answered. Patient is willing to proceed.   Procedure scheduled for 03/27/2020 at 13:00pm with Dr. Bjorn Pippin.   Corrin Parker, PA-C 03/26/2020 5:25 PM

## 2020-03-26 NOTE — Evaluation (Signed)
Physical Therapy Evaluation Patient Details Name: Kyle Ware MRN: [id number] DOB: Jan 14, 1976 Today's Date: 03/26/2020   History of Present Illness  44 yo male presenting with acute left sided weakness. Given tPA at 2315 on 03/25/20.  MRI pending. Recently diagnosed with COVID-19 starting 03/07/20. PMH including asthma.   Clinical Impression  Pt mobilized down the hallway well, accepted challenge with only very mild balance deficits (he self corrected any sway in his walking path).  MD and technician was waiting on pt to do bubble study, so deferred stairs and complete DGI for next session.  We can likely sign off after higher level balance challenges and opportunity to fatigue his legs more without signs of L LE drag is completed (per OT L LE fatigued with her).   PT to follow acutely for deficits listed below.      Follow Up Recommendations No PT follow up    Equipment Recommendations  None recommended by PT    Recommendations for Other Services   NA    Precautions / Restrictions Precautions Precautions: Fall Precaution Comments: mildly unsteady      Mobility  Bed Mobility Overal bed mobility: Independent                Transfers Overall transfer level: Modified independent Equipment used: None Transfers: Sit to/from Stand Sit to Stand: Modified independent (Device/Increase time)         General transfer comment: Supervision for safety  Ambulation/Gait Ambulation/Gait assistance: Min guard Gait Distance (Feet): 130 Feet Assistive device: None Gait Pattern/deviations: Step-through pattern;Staggering right;Staggering left   Gait velocity interpretation: >4.37 ft/sec, indicative of normal walking speed General Gait Details: Pt with very mildly staggering gait pattern, able to self correct, min guard assist for safety      Modified Rankin (Stroke Patients Only) Modified Rankin (Stroke Patients Only) Pre-Morbid Rankin Score: No symptoms Modified Rankin:  Moderately severe disability     Balance Overall balance assessment: Needs assistance                               Standardized Balance Assessment Standardized Balance Assessment : Dynamic Gait Index   Dynamic Gait Index Level Surface: Mild Impairment Change in Gait Speed: Normal Gait and Pivot Turn: Normal Step Over Obstacle: Normal       Pertinent Vitals/Pain Pain Assessment: No/denies pain Faces Pain Scale: No hurt Pain Intervention(s): Monitored during session    Home Living Family/patient expects to be discharged to:: Private residence Living Arrangements: Spouse/significant other Available Help at Discharge: Family Type of Home: House Home Access: Stairs to enter   Secretary/administrator of Steps: 3   Home Equipment: None      Prior Function Level of Independence: Independent         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 Pt able to perform gross motor and fine motor skills. Dropping tooth brush on floor when having conversation due to decreased awareness of LUE.  LUE Sensation: decreased light touch(numbness)    Lower Extremity Assessment Lower Extremity Assessment: Overall WFL for tasks assessed(strength tested bed level 5/5 bil, sensation intact to LT)    Cervical / Trunk Assessment Cervical / Trunk Assessment: Normal  Communication   Communication:  No difficulties  Cognition Arousal/Alertness: Awake/alert Behavior During Therapy: WFL for tasks assessed/performed Overall Cognitive Status: Within Functional Limits for tasks assessed                                 General Comments: Feel pt near baseline cognition. Following commands, able to recall ST memory words, and performing simple  path finding skills.       General Comments General comments (skin integrity, edema, etc.): BEFAST education repeated to pt and wife.         Assessment/Plan    PT Assessment Patient needs continued PT services  PT Problem List Decreased balance;Decreased mobility       PT Treatment Interventions DME instruction;Gait training;Stair training;Functional mobility training;Therapeutic activities;Therapeutic exercise;Patient/family education;Neuromuscular re-education;Balance training    PT Goals (Current goals can be found in the Care Plan section)  Acute Rehab PT Goals Patient Stated Goal: Return home, today if possible PT Goal Formulation: With patient Time For Goal Achievement: 04/09/20 Potential to Achieve Goals: Good    Frequency Min 4X/week           AM-PAC PT "6 Clicks" Mobility  Outcome Measure Help needed turning from your back to your side while in a flat bed without using bedrails?: None Help needed moving from lying on your back to sitting on the side of a flat bed without using bedrails?: None Help needed moving to and from a bed to a chair (including a wheelchair)?: None Help needed standing up from a chair using your arms (e.g., wheelchair or bedside chair)?: None Help needed to walk in hospital room?: A Little Help needed climbing 3-5 steps with a railing? : A Little 6 Click Score: 22    End of Session   Activity Tolerance: Patient tolerated treatment well Patient left: in bed;with call bell/phone within reach;Other (comment)(with MD in room for bubble study) Nurse Communication: Mobility status PT Visit Diagnosis: Unsteadiness on feet (R26.81)    Time: 7681-1572 PT Time Calculation (min) (ACUTE ONLY): 17 min   Charges:       Verdene Lennert, PT, DPT  Acute Rehabilitation [phone] pager #[phone] office     PT Evaluation $PT Eval Moderate Complexity: 1 Mod         03/26/2020, 4:17 PM

## 2020-03-26 NOTE — Progress Notes (Signed)
Lower venous duplex and TCD bubble       have been completed. Preliminary results can be found under CV proc through chart review. Jeb Levering, BS, RDMS, RVT

## 2020-03-26 NOTE — Evaluation (Signed)
Occupational Therapy Evaluation Patient Details Name: Kyle Ware MRN: [id number] DOB: November 24, 1976 Today's Date: 03/26/2020    History of Present Illness 44 yo male presenting with acute left sided weakness. Given tPA at 2315 on 03/25/20. Recently diagnosed with COVID-19 starting 03/07/20. PMH including asthma.    Clinical Impression   PTA, pt was living with his wife and was independent. Pt currently performing ADLs at Supervision level. Pt demonstrating decreased coordination and attention to LUE. During oral care at sink, pt dropping his tooth brush and demonstrating decreased in-hand manipulation. Pt also reporting numbness at hand. Providing pt with education on BEFAST and he verbalized understanding. Pt would benefit from further acute OT to address L hand dexterity and provide home exercises program. Recommend dc to home once medically stable per physician.     Follow Up Recommendations  No OT follow up;Supervision - Intermittent    Equipment Recommendations  None recommended by OT    Recommendations for Other Services PT consult     Precautions / Restrictions Precautions Precautions: Fall      Mobility Bed Mobility Overal bed mobility: Independent                Transfers Overall transfer level: Needs assistance Equipment used: None Transfers: Sit to/from Stand Sit to Stand: Supervision         General transfer comment: Supervision for safety    Balance Overall balance assessment: No apparent balance deficits (not formally assessed)                                         ADL either performed or assessed with clinical judgement   ADL Overall ADL's : Needs assistance/impaired Eating/Feeding: Independent;Sitting   Grooming: Oral care;Supervision/safety;Standing Grooming Details (indicate cue type and reason): During oral care, pt presenting with decreased coorindation and awareness at LUE. Pt dropping to tooth brush with decreased  attention to LUE duirng conversation. Pt holding tooth brush between his fingers in an alternative grasp to attempt and maintain grasp after havign dropped his first tooth brush.  Upper Body Bathing: Supervision/ safety;Sitting   Lower Body Bathing: Supervison/ safety;Sit to/from stand   Upper Body Dressing : Supervision/safety;Sitting   Lower Body Dressing: Supervision/safety;Sit to/from stand Lower Body Dressing Details (indicate cue type and reason): Pt donning socks, underwear, and pants. Poor FM skills to fasten button at pants. Requiring increased time Toilet Transfer: Supervision/safety;Ambulation           Functional mobility during ADLs: Supervision/safety General ADL Comments: Pt presenting with decreased coorindation and attention to LUE.      Vision Baseline Vision/History: No visual deficits Patient Visual Report: No change from baseline       Perception     Praxis      Pertinent Vitals/Pain Pain Assessment: Faces Faces Pain Scale: No hurt Pain Intervention(s): Monitored during session     Hand Dominance Right   Extremity/Trunk Assessment Upper Extremity Assessment Upper Extremity Assessment: LUE deficits/detail LUE Deficits / Details: Poor coorindation and awareness of LUE. Pt able to perform gross motor and fine motor skills. Dropping tooth brush on floor when having conversation due to decreased awareness of LUE.  LUE Sensation: decreased light touch(numbness)   Lower Extremity Assessment Lower Extremity Assessment: Defer to PT evaluation   Cervical / Trunk Assessment Cervical / Trunk Assessment: Normal   Communication Communication Communication: No difficulties   Cognition Arousal/Alertness: Awake/alert Behavior  During Therapy: WFL for tasks assessed/performed Overall Cognitive Status: Within Functional Limits for tasks assessed                                 General Comments: Feel pt near baseline cognition. Following commands,  able to recall ST memory words, and performing simple path finding skills.    General Comments  VSS on RA. 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        OT Problem List: Decreased activity tolerance;Decreased knowledge of use of DME or AE;Decreased knowledge of precautions;Impaired UE functional use      OT Treatment/Interventions: Therapeutic exercise;Self-care/ADL training;DME and/or AE instruction;Energy conservation;Cognitive remediation/compensation;Patient/family education    OT Goals(Current goals can be found in the care plan section) Acute Rehab OT Goals Patient Stated Goal: Return home OT Goal Formulation: With patient Time For Goal Achievement: 04/09/20 Potential to Achieve Goals: Good  OT Frequency: Min 2X/week   Barriers to D/C:            Co-evaluation              AM-PAC OT "6 Clicks" Daily Activity     Outcome Measure Help from another person eating meals?: None Help from another person taking care of personal grooming?: A Little Help from another person toileting, which includes using toliet, bedpan, or urinal?: A Little Help from another person bathing (including washing, rinsing, drying)?: A Little Help from another person to put on and taking off regular upper body clothing?: A Little Help from another person to put on and taking off regular lower body clothing?: A Little 6 Click Score: 19   End of Session Nurse Communication: Mobility  status  Activity Tolerance: Patient tolerated treatment well Patient left: in chair;with call bell/phone within reach;with chair alarm set  OT Visit Diagnosis: Unsteadiness on feet (R26.81);Other abnormalities of gait and mobility (R26.89);Muscle weakness (generalized) (M62.81)                Time: 0737-1062 OT Time Calculation (min): 24 min Charges:  OT General Charges $OT Visit: 1 Visit OT Evaluation $OT Eval Moderate Complexity: 1 Mod OT Treatments $Self Care/Home Management : 8-22 mins  Aydenn Gervin MSOT, OTR/L Acute Rehab Pager: [phone] Office: [place] 03/26/2020, 3:13 PM

## 2020-03-27 ENCOUNTER — Inpatient Hospital Stay (HOSPITAL_COMMUNITY): Payer: Federal, State, Local not specified - PPO | Admitting: Certified Registered"

## 2020-03-27 ENCOUNTER — Inpatient Hospital Stay (HOSPITAL_COMMUNITY): Payer: Federal, State, Local not specified - PPO

## 2020-03-27 ENCOUNTER — Encounter (HOSPITAL_COMMUNITY)
Admission: EM | Disposition: A | Discharge status: Home / Self Care | Payer: Self-pay | Source: Home / Self Care | Attending: Neurology

## 2020-03-27 ENCOUNTER — Encounter (HOSPITAL_COMMUNITY): Payer: Self-pay | Admitting: Neurology

## 2020-03-27 DIAGNOSIS — I639 Cerebral infarction, unspecified: Secondary | ICD-10-CM

## 2020-03-27 DIAGNOSIS — I63411 Cerebral infarction due to embolism of right middle cerebral artery: Principal | ICD-10-CM

## 2020-03-27 HISTORY — PX: TEE WITHOUT CARDIOVERSION: SHX5443

## 2020-03-27 HISTORY — PX: BUBBLE STUDY: SHX6837

## 2020-03-27 LAB — CBC
HCT: 32 % — ABNORMAL LOW (ref 39.0–52.0)
Hemoglobin: 9.4 g/dL — ABNORMAL LOW (ref 13.0–17.0)
MCH: 22.5 pg — ABNORMAL LOW (ref 26.0–34.0)
MCHC: 29.4 g/dL — ABNORMAL LOW (ref 30.0–36.0)
MCV: 76.6 fL — ABNORMAL LOW (ref 80.0–100.0)
Platelets: 303 10*3/uL (ref 150–400)
RBC: 4.18 MIL/uL — ABNORMAL LOW (ref 4.22–5.81)
RDW: 18.1 % — ABNORMAL HIGH (ref 11.5–15.5)
WBC: 7.3 10*3/uL (ref 4.0–10.5)
nRBC: 0 % (ref 0.0–0.2)

## 2020-03-27 LAB — BASIC METABOLIC PANEL
Anion gap: 7 (ref 5–15)
BUN: 12 mg/dL (ref 6–20)
CO2: 24 mmol/L (ref 22–32)
Calcium: 8.3 mg/dL — ABNORMAL LOW (ref 8.9–10.3)
Chloride: 107 mmol/L (ref 98–111)
Creatinine, Ser: 1.02 mg/dL (ref 0.61–1.24)
GFR calc Af Amer: >60 mL/min (ref >60)
GFR calc non Af Amer: >60 mL/min (ref >60)
Glucose, Bld: 102 mg/dL — ABNORMAL HIGH (ref 70–99)
Potassium: 3.7 mmol/L (ref 3.5–5.1)
Sodium: 138 mmol/L (ref 135–145)

## 2020-03-27 LAB — C-REACTIVE PROTEIN: CRP: 0.6 mg/dL (ref <1.0)

## 2020-03-27 LAB — FIBRINOGEN: Fibrinogen: 113 mg/dL — ABNORMAL LOW (ref 210–475)

## 2020-03-27 LAB — FERRITIN: Ferritin: 5 ng/mL — ABNORMAL LOW (ref 24–336)

## 2020-03-27 LAB — D-DIMER, QUANTITATIVE: D-Dimer, Quant: 3.71 ug/mL-FEU — ABNORMAL HIGH (ref 0.00–0.50)

## 2020-03-27 SURGERY — ECHOCARDIOGRAM, TRANSESOPHAGEAL
Anesthesia: Monitor Anesthesia Care

## 2020-03-27 MED ORDER — ATORVASTATIN CALCIUM 80 MG PO TABS: 80.0000 mg | ORAL_TABLET | Freq: Every day | ORAL | 3 refills | Status: AC

## 2020-03-27 MED ORDER — PROPOFOL 500 MG/50ML IV EMUL
INTRAVENOUS | Status: DC | PRN
  Administered 2020-03-27: 100 ug/kg/min via INTRAVENOUS

## 2020-03-27 MED ORDER — CLOPIDOGREL BISULFATE 75 MG PO TABS: 75.0000 mg | ORAL_TABLET | Freq: Every day | ORAL | 0 refills | Status: AC

## 2020-03-27 MED ORDER — PROPOFOL 10 MG/ML IV BOLUS
INTRAVENOUS | Status: DC | PRN
  Administered 2020-03-27 (×5): 10 mg via INTRAVENOUS

## 2020-03-27 MED ORDER — ASPIRIN 81 MG PO TBEC: 81.0000 mg | DELAYED_RELEASE_TABLET | Freq: Every day | ORAL | 3 refills | Status: AC

## 2020-03-27 MED ORDER — PHENYLEPHRINE 40 MCG/ML (10ML) SYRINGE FOR IV PUSH (FOR BLOOD PRESSURE SUPPORT)
PREFILLED_SYRINGE | INTRAVENOUS | Status: DC | PRN
  Administered 2020-03-27: 80 ug via INTRAVENOUS

## 2020-03-27 MED ORDER — SODIUM CHLORIDE 0.9 % IV SOLN
INTRAVENOUS | Status: AC | PRN
  Administered 2020-03-27: 500 mL via INTRAMUSCULAR

## 2020-03-27 MED ORDER — CLOPIDOGREL BISULFATE 75 MG PO TABS
75.0000 mg | ORAL_TABLET | Freq: Every day | ORAL | Status: DC
  Administered 2020-03-27: 75 mg via ORAL
  Filled 2020-03-27: qty 1

## 2020-03-27 MED ORDER — ASPIRIN EC 81 MG PO TBEC
81.0000 mg | DELAYED_RELEASE_TABLET | Freq: Every day | ORAL | Status: DC
  Administered 2020-03-27: 81 mg via ORAL
  Filled 2020-03-27: qty 1

## 2020-03-27 MED ORDER — SODIUM CHLORIDE 0.9 % IV SOLN: INTRAVENOUS | Status: DC | PRN

## 2020-03-27 MED ORDER — ENOXAPARIN SODIUM 40 MG/0.4ML ~~LOC~~ SOLN: 40.0000 mg | SUBCUTANEOUS | Status: DC

## 2020-03-27 NOTE — Interval H&P Note (Signed)
History and Physical Interval Note:  03/27/2020 12:40 PM  Kyle Ware  has presented today for surgery, with the diagnosis of STROKE.  The various methods of treatment have been discussed with the patient and family. After consideration of risks, benefits and other options for treatment, the patient has consented to  Procedure(s): TRANSESOPHAGEAL ECHOCARDIOGRAM (TEE) (N/A) as a surgical intervention.  The patient's history has been reviewed, patient examined, no change in status, stable for surgery.  I have reviewed the patient's chart and labs.  Questions were answered to the patient's satisfaction.     Little Ishikawa

## 2020-03-27 NOTE — TOC Transition Note (Signed)
Transition of Care [facility]) - CM/SW Discharge Note   Patient Details  Name: Kyle Ware MRN: [id number] Date of Birth: 1975-12-21  [facility] CM/SW Contact:  Glennon Mac, RN Phone Number: 03/27/2020, 3:46 PM   Clinical Narrative: 44 yo male presenting with acute left sided weakness. Given tPA at 2315 on 03/25/20.  MRI pending. Recently diagnosed with COVID-19 starting 03/07/20.    PTA, pt independent, lives at home with spouse.  PT/OT recommending no OP follow up or DME.  Pt has no PCP; provided [facility] physician referral service number to assist with obtaining PCP.     Final next level of care: Home/Self Care Barriers to Discharge: Barriers Resolved                       Discharge Plan and Services   Discharge Planning Services: CM Consult                                 Social Determinants of Health (SDOH) Interventions     Readmission Risk Interventions No flowsheet data found.  Quintella Baton, RN, BSN  Trauma/Neuro ICU Case Manager [phone]

## 2020-03-27 NOTE — CV Procedure (Addendum)
     TRANSESOPHAGEAL ECHOCARDIOGRAM   NAME:  Kyle Ware   MRN: [id number] DOB:  01/23/1976   ADMIT DATE: 03/25/2020  INDICATIONS: Acute CVA  PROCEDURE:   Informed consent was obtained prior to the procedure. The risks, benefits and alternatives for the procedure were discussed and the patient comprehended these risks.  Risks include, but are not limited to, cough, sore throat, vomiting, nausea, somnolence, esophageal and stomach trauma or perforation, bleeding, low blood pressure, aspiration, pneumonia, infection, trauma to the teeth and death.    After a procedural time-out, the oropharynx was anesthetized and the patient was sedated by the anesthesia service. The transesophageal probe was inserted in the esophagus without difficulty and multiple views were obtained. Anesthesia was monitored by Rise Patience, CRNA.    COMPLICATIONS:    Patient was having desaturations and as a result probe was withdrawn prior to obtaining transgastric views.  Patient had repeatedly tried to push the bite block out of his mouth during the procedure, and the end of the procedure when bite block was removed, he was noted to be missing part of one of his bottom teeth, which was found in his mouth and removed.  FINDINGS:  Positive bubble study consistent with PFO.  Epifanio Lesches MD [facility]  [address], [address] [place], [place] [address] [phone]   1:30 PM

## 2020-03-27 NOTE — Transfer of Care (Signed)
Immediate Anesthesia Transfer of Care Note  Patient: Kyle Ware  Procedure(s) Performed: TRANSESOPHAGEAL ECHOCARDIOGRAM (TEE) (N/A ) BUBBLE STUDY  Patient Location: Endoscopy Unit  Anesthesia Type:MAC  Level of Consciousness: awake, alert  and oriented  Airway & Oxygen Therapy: Patient Spontanous Breathing  Post-op Assessment: Report given to RN, Post -op Vital signs reviewed and stable and Patient moving all extremities X 4  Post vital signs: Reviewed and stable  Last Vitals:  Vitals Value Taken Time  BP 110/80 03/27/20 1332  Temp    Pulse 79 03/27/20 1332  Resp 18 03/27/20 1332  SpO2 100 % 03/27/20 1332  Vitals shown include unvalidated device data.  Last Pain:  Vitals:   03/27/20 1332  TempSrc:   PainSc: 0-No pain         Complications: No apparent anesthesia complications

## 2020-03-27 NOTE — Anesthesia Postprocedure Evaluation (Signed)
Anesthesia Post Note  Patient: TEIGEN BELLIN  Procedure(s) Performed: TRANSESOPHAGEAL ECHOCARDIOGRAM (TEE) (N/A ) BUBBLE STUDY     Patient location during evaluation: PACU Anesthesia Type: MAC Level of consciousness: awake and alert Pain management: pain level controlled Vital Signs Assessment: post-procedure vital signs reviewed and stable Respiratory status: spontaneous breathing, nonlabored ventilation and respiratory function stable Cardiovascular status: stable and blood pressure returned to baseline Anesthetic complications: no Comments: Tooth came out upon removal of bite block at end of procedure. CRNA discussed issue with patient after procedure.    Last Vitals:  Vitals:   03/27/20 1340 03/27/20 1350  BP: 124/86 132/81  Pulse: 71 64  Resp: 14 11  Temp:    SpO2: 99% 99%    Last Pain:  Vitals:   03/27/20 1338  TempSrc:   PainSc: 0-No pain                 Audry Pili

## 2020-03-27 NOTE — Discharge Summary (Addendum)
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 s/p IV TPA with excellent clinical recovery Patent foramen ovale Hyperlipidemia   LABORATORY STUDIES CBC    Component Value Date/Time   WBC 7.3 03/27/2020 0239   RBC 4.18 (L) 03/27/2020 0239   HGB 9.4 (L) 03/27/2020 0239   HCT 32.0 (L) 03/27/2020 0239   PLT 303 03/27/2020 0239   MCV 76.6 (L) 03/27/2020 0239   MCH 22.5 (L) 03/27/2020 0239   MCHC 29.4 (L) 03/27/2020 0239   RDW 18.1 (H) 03/27/2020 0239   LYMPHSABS 0.9 03/25/2020 2304   MONOABS 1.5 (H) 03/25/2020 2304   EOSABS 0.0 03/25/2020 2304   BASOSABS 0.0 03/25/2020 2304   CMP    Component Value Date/Time   NA 138 03/27/2020 0239   K 3.7 03/27/2020 0239   CL 107 03/27/2020 0239   CO2 24 03/27/2020 0239   GLUCOSE 102 (H) 03/27/2020 0239   BUN 12 03/27/2020 0239   CREATININE 1.02 03/27/2020 0239   CALCIUM 8.3 (L) 03/27/2020 0239   PROT 6.4 (L) 03/25/2020 2304   ALBUMIN 3.8 03/25/2020 2304   AST 16 03/25/2020 2304   ALT 11 03/25/2020 2304   ALKPHOS 49 03/25/2020 2304   BILITOT 0.5 03/25/2020 2304   GFRNONAA >60 03/27/2020 0239   GFRAA >60 03/27/2020 0239   COAGS Lab Results  Component Value Date   INR 1.4 (H) 03/26/2020   INR >10.0 (HH) 03/25/2020   Lipid Panel    Component Value Date/Time   CHOL 273 (H) 03/26/2020 0317   TRIG 410 (H) 03/26/2020 0317   HDL 37 (L) 03/26/2020 0317   CHOLHDL 7.4 03/26/2020 0317   VLDL UNABLE TO CALCULATE IF TRIGLYCERIDE OVER 400 mg/dL 32/95/1884 1660   LDLCALC UNABLE TO CALCULATE IF TRIGLYCERIDE OVER 400  mg/dL 63/11/6008 9323   FTDD2K  Lab Results  Component Value Date   HGBA1C 5.7 (H) 03/26/2020   Urinalysis    Component Value Date/Time   COLORURINE YELLOW (A) 04/24/2019 0924   APPEARANCEUR CLOUDY (A) 04/24/2019 0924   LABSPEC 1.013 04/24/2019 0924   PHURINE 8.0 04/24/2019 0924   GLUCOSEU NEGATIVE 04/24/2019 0924   HGBUR NEGATIVE 04/24/2019 0924   BILIRUBINUR NEGATIVE 04/24/2019 0924   KETONESUR NEGATIVE 04/24/2019 0924   PROTEINUR NEGATIVE 04/24/2019 0924   NITRITE NEGATIVE 04/24/2019 0924   LEUKOCYTESUR NEGATIVE 04/24/2019 0924   Urine Drug Screen No results found for: LABOPIA, COCAINSCRNUR, LABBENZ, AMPHETMU, THCU, LABBARB  Alcohol Level No results found for: [facility]   SIGNIFICANT DIAGNOSTIC STUDIES CT Code Stroke CTA Head W/WO contrast  Result Date: 03/26/2020 CLINICAL DATA:  Initial evaluation for acute stroke, left-sided facial droop and weakness, abnormal gaze, slurred speech. EXAM: CT ANGIOGRAPHY HEAD AND NECK TECHNIQUE: Multidetector CT imaging of the head and neck was performed using the standard protocol during bolus administration of intravenous contrast. Multiplanar CT image reconstructions and MIPs were obtained to evaluate the vascular anatomy. Carotid stenosis measurements (when applicable) are obtained utilizing NASCET criteria, using the distal internal carotid diameter as the denominator. CONTRAST:  86mL OMNIPAQUE IOHEXOL  350 MG/ML SOLN COMPARISON:  Prior head CT from earlier same day. FINDINGS: CTA NECK FINDINGS Aortic arch: Visualized aortic arch of normal caliber with normal 3 vessel morphology. No hemodynamically significant stenosis or other abnormality about the origin of the great vessels. Visualized subclavian arteries widely patent. Right carotid system: Right common carotid artery widely patent from its origin to the bifurcation without stenosis. Irregular soft plaque seen about the right bifurcation/proximal right ICA with associated stenosis of up to 50% by  NASCET criteria. Right ICA otherwise widely patent distally to the skull base without stenosis, dissection or occlusion. Left carotid system: Left common and internal carotid arteries widely patent without stenosis, dissection or occlusion. No significant atheromatous narrowing about the left bifurcation. Vertebral arteries: Both vertebral arteries arise from the subclavian arteries. Left vertebral artery dominant with a diffusely hypoplastic right vertebral artery. Vertebral arteries widely patent within the neck without stenosis, dissection, or occlusion. Skeleton: No acute osseous abnormality. No discrete or worrisome osseous lesions. Other neck: No other acute soft tissue abnormality within the neck. No mass lesion or adenopathy. Upper chest: Mild scattered tree in bud reticular densities seen within the peripheral right upper lobe, nonspecific, but suspicious for possible mild bronchiolitis/pneumonitis. Visualized upper chest demonstrates no other acute finding. Review of the MIP images confirms the above findings CTA HEAD FINDINGS Anterior circulation: Both internal carotid arteries widely patent to the termini without stenosis or other abnormality. A1 segments widely patent. Normal anterior communicating artery. Anterior cerebral arteries widely patent to their distal aspects. No M1 stenosis or occlusion. Normal MCA bifurcations. Distal MCA branches well perfused and symmetric. Posterior circulation: Vertebral arteries widely patent to the vertebrobasilar junction without stenosis. Both picas patent. Basilar patent to its distal aspect without stenosis. Superior cerebral arteries patent bilaterally. Left PCA supplied via the basilar. Right PCA supplied via a hypoplastic right P1 segment and robust right posterior communicating artery. Both PCAs well perfused to their distal aspects without stenosis. Venous sinuses: Patent. Anatomic variants: Predominant fetal type origin of the right PCA. No aneurysm. Review  of the MIP images confirms the above findings IMPRESSION: 1. Negative CTA for large vessel occlusion. 2. Atheromatous plaque about the right carotid bifurcation/proximal right ICA with associated stenosis of up to 50% by NASCET criteria. 3. Otherwise wide patency of the major arterial vasculature of the head and neck. No other hemodynamically significant or correctable stenosis. No other significant atheromatous disease for age. 4. Mild tree-in-bud reticular densities within the peripheral right upper lobe, nonspecific, but suspicious for mild pneumonitis/small airways disease. These results were communicated to Dr. Amada JupiterKirkpatrick at 11:47 pmon 4/28/2021by text page via the [facility] messaging system. Electronically Signed   By: Rise MuBenjamin  McClintock M.D.   On: 03/26/2020 00:03   CT Code Stroke CTA Neck W/WO contrast  Result Date: 03/26/2020 CLINICAL DATA:  Initial evaluation for acute stroke, left-sided facial droop and weakness, abnormal gaze, slurred speech. EXAM: CT ANGIOGRAPHY HEAD AND NECK TECHNIQUE: Multidetector CT imaging of the head and neck was performed using the standard protocol during bolus administration of intravenous contrast. Multiplanar CT image reconstructions and MIPs were obtained to evaluate the vascular anatomy. Carotid stenosis measurements (when applicable) are obtained utilizing NASCET criteria, using the distal internal carotid diameter as the denominator. CONTRAST:  80mL OMNIPAQUE IOHEXOL 350 MG/ML SOLN COMPARISON:  Prior head CT from earlier same day. FINDINGS: CTA NECK FINDINGS Aortic arch: Visualized aortic arch of normal caliber with normal 3 vessel morphology. No hemodynamically significant stenosis or other abnormality about the origin  of the great vessels. Visualized subclavian arteries widely patent. Right carotid system: Right common carotid artery widely patent from its origin to the bifurcation without stenosis. Irregular soft plaque seen about the right bifurcation/proximal  right ICA with associated stenosis of up to 50% by NASCET criteria. Right ICA otherwise widely patent distally to the skull base without stenosis, dissection or occlusion. Left carotid system: Left common and internal carotid arteries widely patent without stenosis, dissection or occlusion. No significant atheromatous narrowing about the left bifurcation. Vertebral arteries: Both vertebral arteries arise from the subclavian arteries. Left vertebral artery dominant with a diffusely hypoplastic right vertebral artery. Vertebral arteries widely patent within the neck without stenosis, dissection, or occlusion. Skeleton: No acute osseous abnormality. No discrete or worrisome osseous lesions. Other neck: No other acute soft tissue abnormality within the neck. No mass lesion or adenopathy. Upper chest: Mild scattered tree in bud reticular densities seen within the peripheral right upper lobe, nonspecific, but suspicious for possible mild bronchiolitis/pneumonitis. Visualized upper chest demonstrates no other acute finding. Review of the MIP images confirms the above findings CTA HEAD FINDINGS Anterior circulation: Both internal carotid arteries widely patent to the termini without stenosis or other abnormality. A1 segments widely patent. Normal anterior communicating artery. Anterior cerebral arteries widely patent to their distal aspects. No M1 stenosis or occlusion. Normal MCA bifurcations. Distal MCA branches well perfused and symmetric. Posterior circulation: Vertebral arteries widely patent to the vertebrobasilar junction without stenosis. Both picas patent. Basilar patent to its distal aspect without stenosis. Superior cerebral arteries patent bilaterally. Left PCA supplied via the basilar. Right PCA supplied via a hypoplastic right P1 segment and robust right posterior communicating artery. Both PCAs well perfused to their distal aspects without stenosis. Venous sinuses: Patent. Anatomic variants: Predominant fetal  type origin of the right PCA. No aneurysm. Review of the MIP images confirms the above findings IMPRESSION: 1. Negative CTA for large vessel occlusion. 2. Atheromatous plaque about the right carotid bifurcation/proximal right ICA with associated stenosis of up to 50% by NASCET criteria. 3. Otherwise wide patency of the major arterial vasculature of the head and neck. No other hemodynamically significant or correctable stenosis. No other significant atheromatous disease for age. 4. Mild tree-in-bud reticular densities within the peripheral right upper lobe, nonspecific, but suspicious for mild pneumonitis/small airways disease. These results were communicated to Dr. Amada Jupiter at 11:47 pmon 4/28/2021by text page via the [facility] messaging system. Electronically Signed   By: Rise Mu M.D.   On: 03/26/2020 00:03   MR BRAIN WO CONTRAST  Result Date: 03/26/2020 CLINICAL DATA:  Status post tPA. Improved stroke symptoms. EXAM: MRI HEAD WITHOUT CONTRAST TECHNIQUE: Multiplanar, multiecho pulse sequences of the brain and surrounding structures were obtained without intravenous contrast. COMPARISON:  Head CT 03/25/2020 FINDINGS: BRAIN: Multifocal abnormal diffusion restriction within the posterior right MCA territory, including along the postcentral gyrus and within the right parietal lobe and right caudate head. There also small areas of ischemia within the right occipital lobe, in the PCA territory. Normal white matter signal for age. Normal volume of brain parenchyma and CSF spaces. Midline structures are normal. VASCULAR: Major flow voids are preserved. Susceptibility-sensitive sequences show no chronic microhemorrhage or superficial siderosis. SKULL AND UPPER CERVICAL SPINE: Normal calvarium and skull base. Visualized upper cervical spine and soft tissues are normal. SINUSES/ORBITS: No paranasal sinus fluid levels or advanced mucosal thickening. No mastoid or middle ear effusion. Normal orbits. IMPRESSION:  1. Multifocal acute ischemia within the right middle and posterior cerebral artery territories.  Given the presence of a right P-comm, this pattern is compatible with emboli from the right carotid system. 2. No hemorrhage or mass effect. Electronically Signed   By: Deatra Robinson M.D.   On: 03/26/2020 23:04   VAS [place] TRANSCRANIAL DOPPLER W BUBBLES  Result Date: 03/26/2020  Transcranial Doppler with Bubble Indications: Stroke. Performing Technologist: Jeb Levering RDMS, RVT  Examination Guidelines: A complete evaluation includes B-mode imaging, spectral Doppler, color Doppler, and power Doppler as needed of all accessible portions of each vessel. Bilateral testing is considered an integral part of a complete examination. Limited examinations for reoccurring indications may be performed as noted.  Summary:  A vascular evaluation was performed. The right middle cerebral artery was studied. An IV was inserted into the patient's left AC fossa. Verbal informed consent was obtained.  HITS at rest: mild HITS during Valsalva: moderate, partial curtain sign Evidence of medium size PFO *See table(s) above for TCD measurements and observations.    Preliminary    ECHOCARDIOGRAM COMPLETE  Result Date: 03/26/2020    ECHOCARDIOGRAM REPORT   Patient Name:   Kyle Ware Date of Exam: 03/26/2020 Medical Rec #:  [id number]          Height:       68.0 in Accession #:    [id number]         Weight:       170.4 lb Date of Birth:  26-Aug-1976          BSA:          1.909 m Patient Age:    43 years           BP:           116/77 mmHg Patient Gender: M                  HR:           72 bpm. Exam Location:  Inpatient Procedure: 2D Echo, Cardiac Doppler and Color Doppler Indications:    Stroke 434.91 / I163.9  History:        Patient has no prior history of Echocardiogram examinations.  Sonographer:    Elmarie Shiley Dance Referring Phys: Tara.Kingdom MCNEILL P KIRKPATRICK IMPRESSIONS  1. Left ventricular ejection fraction, by estimation, is 65 to  70%. The left ventricle has normal function. The left ventricle has no regional wall motion abnormalities. Left ventricular diastolic parameters are consistent with Grade I diastolic dysfunction (impaired relaxation).  2. Right ventricular systolic function is normal. The right ventricular size is normal.  3. The mitral valve is normal in structure. No evidence of mitral valve regurgitation. No evidence of mitral stenosis.  4. The aortic valve is normal in structure. Aortic valve regurgitation is not visualized. No aortic stenosis is present.  5. The inferior vena cava is normal in size with greater than 50% respiratory variability, suggesting right atrial pressure of 3 mmHg. FINDINGS  Left Ventricle: Left ventricular ejection fraction, by estimation, is 65 to 70%. The left ventricle has normal function. The left ventricle has no regional wall motion abnormalities. The left ventricular internal cavity size was normal in size. There is  no left ventricular hypertrophy. Left ventricular diastolic parameters are consistent with Grade I diastolic dysfunction (impaired relaxation). Indeterminate filling pressures. Right Ventricle: The right ventricular size is normal. No increase in right ventricular wall thickness. Right ventricular systolic function is normal. Left Atrium: Left atrial size was normal in size. Right Atrium: Right atrial size was normal in size.  Pericardium: There is no evidence of pericardial effusion. Mitral Valve: The mitral valve is normal in structure. Normal mobility of the mitral valve leaflets. No evidence of mitral valve regurgitation. No evidence of mitral valve stenosis. Tricuspid Valve: The tricuspid valve is normal in structure. Tricuspid valve regurgitation is not demonstrated. No evidence of tricuspid stenosis. Aortic Valve: The aortic valve is normal in structure. Aortic valve regurgitation is not visualized. No aortic stenosis is present. Pulmonic Valve: The pulmonic valve was normal in  structure. Pulmonic valve regurgitation is not visualized. No evidence of pulmonic stenosis. Aorta: The aortic root is normal in size and structure. Venous: The inferior vena cava is normal in size with greater than 50% respiratory variability, suggesting right atrial pressure of 3 mmHg. IAS/Shunts: No atrial level shunt detected by color flow Doppler.  LEFT VENTRICLE PLAX 2D LVIDd:         5.03 cm  Diastology LVIDs:         3.18 cm  LV e' lateral:   10.40 cm/s LV PW:         1.00 cm  LV E/e' lateral: 6.5 LV IVS:        1.03 cm  LV e' medial:    5.87 cm/s LVOT diam:     1.80 cm  LV E/e' medial:  11.5 LV SV:         52 LV SV Index:   27 LVOT Area:     2.54 cm  RIGHT VENTRICLE             IVC RV Basal diam:  2.74 cm     IVC diam: 1.40 cm RV S prime:     11.50 cm/s TAPSE (M-mode): 2.4 cm LEFT ATRIUM             Index       RIGHT ATRIUM           Index LA diam:        3.90 cm 2.04 cm/m  RA Area:     14.30 cm LA Vol (A2C):   36.3 ml 19.01 ml/m RA Volume:   33.80 ml  17.70 ml/m LA Vol (A4C):   31.9 ml 16.71 ml/m LA Biplane Vol: 34.9 ml 18.28 ml/m  AORTIC VALVE LVOT Vmax:   103.00 cm/s LVOT Vmean:  63.100 cm/s LVOT VTI:    0.206 m  AORTA Ao Root diam: 3.80 cm Ao Asc diam:  2.80 cm MITRAL VALVE MV Area (PHT): 3.27 cm    SHUNTS MV Decel Time: 232 msec    Systemic VTI:  0.21 m MV E velocity: 67.40 cm/s  Systemic Diam: 1.80 cm MV A velocity: 72.30 cm/s MV E/A ratio:  0.93 Chilton Si MD Electronically signed by Chilton Si MD Signature Date/Time: 03/26/2020/4:44:47 PM    Final    CT HEAD CODE STROKE WO CONTRAST  Result Date: 03/25/2020 CLINICAL DATA:  Code stroke. Initial evaluation for acute left-sided facial droop, weakness, slurred speech, abnormal gaze., on EXAM: CT HEAD WITHOUT CONTRAST TECHNIQUE: Contiguous axial images were obtained from the base of the skull through the vertex without intravenous contrast. COMPARISON:  None available. FINDINGS: Brain: Cerebral volume within normal limits for  patient age. No evidence for acute intracranial hemorrhage. No findings to suggest acute large vessel territory infarct. No mass lesion, midline shift, or mass effect. Ventricles are normal in size without evidence for hydrocephalus. No extra-axial fluid collection identified. Vascular: No hyperdense vessel identified. Skull: Scalp soft tissues demonstrate no acute abnormality. Calvarium  intact. Sinuses/Orbits: Globes and orbital soft tissues within normal limits. Visualized paranasal sinuses are clear. No mastoid effusion. ASPECTS [facility] Stroke Program Early CT Score) - Ganglionic level infarction (caudate, lentiform nuclei, internal capsule, insula, M1-M3 cortex): 7 - Supraganglionic infarction (M4-M6 cortex): 3 Total score (0-10 with 10 being normal): 10 IMPRESSION: 1. Negative head CT.  No acute intracranial abnormality identified. 2. ASPECTS is 10. Critical Value/emergent results were called by telephone at the time of interpretation on 03/25/2020 at 11:18 pm to provider Dr. Amada Jupiter, Who verbally acknowledged these results. Electronically Signed   By: Rise Mu M.D.   On: 03/25/2020 23:24   VAS [place] LOWER EXTREMITY VENOUS (DVT)  Result Date: 03/26/2020  Lower Venous DVTStudy Indications: Stroke.  Performing Technologist: Jeb Levering RDMS, RVT  Examination Guidelines: A complete evaluation includes B-mode imaging, spectral Doppler, color Doppler, and power Doppler as needed of all accessible portions of each vessel. Bilateral testing is considered an integral part of a complete examination. Limited examinations for reoccurring indications may be performed as noted. The reflux portion of the exam is performed with the patient in reverse Trendelenburg.  +---------+---------------+---------+-----------+----------+--------------+ RIGHT    CompressibilityPhasicitySpontaneityPropertiesThrombus Aging +---------+---------------+---------+-----------+----------+--------------+ CFV      Full            Yes      Yes                                 +---------+---------------+---------+-----------+----------+--------------+ SFJ      Full                                                        +---------+---------------+---------+-----------+----------+--------------+ FV Prox  Full                                                        +---------+---------------+---------+-----------+----------+--------------+ FV Mid   Full                                                        +---------+---------------+---------+-----------+----------+--------------+ FV DistalFull                                                        +---------+---------------+---------+-----------+----------+--------------+ PFV      Full                                                        +---------+---------------+---------+-----------+----------+--------------+ POP      Full           Yes      Yes                                 +---------+---------------+---------+-----------+----------+--------------+  PTV      Full                                                        +---------+---------------+---------+-----------+----------+--------------+ PERO     Full                                                        +---------+---------------+---------+-----------+----------+--------------+   +---------+---------------+---------+-----------+----------+--------------+ LEFT     CompressibilityPhasicitySpontaneityPropertiesThrombus Aging +---------+---------------+---------+-----------+----------+--------------+ CFV      Full           Yes      Yes                                 +---------+---------------+---------+-----------+----------+--------------+ SFJ      Full                                                        +---------+---------------+---------+-----------+----------+--------------+ FV Prox  Full                                                         +---------+---------------+---------+-----------+----------+--------------+ FV Mid   Full                                                        +---------+---------------+---------+-----------+----------+--------------+ FV DistalFull                                                        +---------+---------------+---------+-----------+----------+--------------+ PFV      Full                                                        +---------+---------------+---------+-----------+----------+--------------+ POP      Full           Yes      Yes                                 +---------+---------------+---------+-----------+----------+--------------+ PTV      Full                                                        +---------+---------------+---------+-----------+----------+--------------+  PERO     Full                                                        +---------+---------------+---------+-----------+----------+--------------+     Summary: RIGHT: - There is no evidence of deep vein thrombosis in the lower extremity.  - No cystic structure found in the popliteal fossa.  LEFT: - There is no evidence of deep vein thrombosis in the lower extremity.  - No cystic structure found in the popliteal fossa.  *See table(s) above for measurements and observations. Electronically signed by Gretta Began MD on 03/26/2020 at 4:25:54 PM.    Final       HISTORY OF PRESENT ILLNESS Kyle Ware is a 44 y.o. male with no PMH since he does not seek medical care, presenting with acute left-sided weakness and was treated with IV tPA. Patient is doing well today, no ongoing stroke symptoms, BP controlled and doing well otherwise.   HOSPITAL COURSE Stroke: Right brain multifocal embolic  infarct ss/p IV TPA with significant clinical improvement. - likely due to cardioembolic source from underlying PFO  Code Stroke CT head No acute abnormality. ASPECTS 10.     MRI    Multifocal acute ischemia within the right middle and posterior cerebral artery territories. Given the presence of a right P-comm, this pattern is compatible with emboli from the right carotid system.  No hemorrhage or mass effect.  2D Echo LVEF 66-70%, grade I diastolic dysfunction, no shunt on TTE  TEE positive bubble study consistent with PFO  LDL UNABLE TO CALCULATE IF TRIGLYCERIDE OVER 400 mg/dL  [id number] 5.7  SCDs/lovenox for VTE prophylaxis  PT/OT cleared  Cardiology team consulted for PFO, pt to follow up outpatient and continue wearing 30 day holter monitor to evaluate for afib/heart rhythm abnormalities.  DISCHARGE EXAM Blood pressure 124/86, pulse 73, temperature 97.8 F (36.6 C), temperature source Axillary, resp. rate 12, height  (1.727 m), weight 77.3 kg, SpO2 98 %.  GEN: NAD, pleasant, cooperative CVS: RRR, no carotid bruit CHEST: No signs of resp distress, on room air ABD: Soft, NTTP  NEURO:  MENTAL STATUS: AAOx3  LANG/SPEECH: Fluent, intact naming, repetition & comprehension  CRANIAL NERVES:  II: Pupils equal and reactive, no RAPD, normal visual field and fundus  III, IV, VI: EOM intact, no gaze preference or deviation  V: normal  VII: no facial asymmetry  VIII: normal hearing to speech  MOTOR: 5/5 in both upper and lower extremities  REFLEXES: 2/4 throughout, bilateral flexor planters  SENSORY: Normal to touch, temperature & pin prick in all extremiteis  COORD: Normal finger to nose and heel to shin, no tremor, no dysmetria  Discharge Diet   - heart healthy diet recommended  DISCHARGE PLAN  Disposition:  home  aspirin 81 mg daily and clopidogrel 75 mg daily for secondary stroke prevention for 3 weeks then asa alone thereafter.  Take atorvastatin nightly to reduce lipid levels.   Ongoing stroke risk factor control by Primary Care Physician at time of discharge  Continue wearing 30 day holter monitor.  Follow-up PCP Patient, No Pcp Per in  2 weeks.  Follow-up in [facility] in 4 weeks, office to schedule an appointment.   Follow up with Dr. Jacinto Halim cardiology as outpatient for 30-day holter monitor for  paroxysmal A. fib and further evaluation/treatment of PFO.  59minutes were spent preparing discharge.  Posey Pronto PA-C [facility] [phone]  I have personally obtained history,examined this patient, reviewed notes, independently viewed imaging studies, participated in medical decision making and plan of care.ROS completed by me personally and pertinent positives fully documented  I have made any additions or clarifications directly to the above note. Agree with note above.  Long discussion with patient and wife and answered questions.  Follow-up as an outpatient stroke clinic in 6 weeks  Antony Contras, MD Medical Director [facility] Pager: [phone] 03/27/2020 3:55 PM

## 2020-03-27 NOTE — Anesthesia Preprocedure Evaluation (Addendum)
Anesthesia Evaluation  Patient identified by MRN, date of birth, ID band Patient awake    Reviewed: Allergy & Precautions, NPO status , Patient's Chart, lab work & pertinent test results  History of Anesthesia Complications Negative for: history of anesthetic complications  Airway Mallampati: II  TM Distance: >3 FB Neck ROM: Full    Dental  (+) Dental Advisory Given, Poor Dentition, Chipped   Pulmonary asthma , Current Smoker and Patient abstained from smoking.,    Pulmonary exam normal        Cardiovascular negative cardio ROS Normal cardiovascular exam   '21 TTE - EF 65 to 70%. Grade I diastolic dysfunction (impaired relaxation). No valvulopathy identified    Neuro/Psych CVA, Residual Symptoms negative psych ROS   GI/Hepatic negative GI ROS, Neg liver ROS,   Endo/Other  negative endocrine ROS  Renal/GU negative Renal ROS     Musculoskeletal negative musculoskeletal ROS (+)   Abdominal   Peds  Hematology  (+) anemia ,   Anesthesia Other Findings   Reproductive/Obstetrics                           Anesthesia Physical Anesthesia Plan  ASA: III  Anesthesia Plan: MAC   Post-op Pain Management:    Induction: Intravenous  PONV Risk Score and Plan: 1 and Propofol infusion and Treatment may vary due to age or medical condition  Airway Management Planned: Nasal Cannula and Natural Airway  Additional Equipment: None  Intra-op Plan:   Post-operative Plan:   Informed Consent: I have reviewed the patients History and Physical, chart, labs and discussed the procedure including the risks, benefits and alternatives for the proposed anesthesia with the patient or authorized representative who has indicated his/her understanding and acceptance.       Plan Discussed with: CRNA and Anesthesiologist  Anesthesia Plan Comments:        Anesthesia Quick Evaluation

## 2020-03-27 NOTE — Social Work (Signed)
CSW met with pt at bedside. CSW introduced self and explained her role. CSW completed sbirt with pt.  Pt scored a 3 on the sbirt scale. Pt reports drinking a beer a couple times a week. Pt denied substance use. Pt did not need resources at this time.  Emeterio Reeve, Latanya Presser, [place] Social Worker [phone]

## 2020-03-27 NOTE — Anesthesia Procedure Notes (Signed)
Procedure Name: MAC Date/Time: 03/27/2020 12:58 PM Performed by: Harden Mo, CRNA Pre-anesthesia Checklist: Patient identified, Emergency Drugs available, Suction available and Patient being monitored Patient Re-evaluated:Patient Re-evaluated prior to induction Oxygen Delivery Method: Nasal cannula Preoxygenation: Pre-oxygenation with 100% oxygen Induction Type: IV induction Placement Confirmation: positive ETCO2 and breath sounds checked- equal and bilateral Dental Injury: Teeth and Oropharynx as per pre-operative assessment

## 2020-03-27 NOTE — Progress Notes (Signed)
Occupational Therapy Treatment Patient Details Name: Kyle Ware MRN: [id number] DOB: 07-Aug-1976 Today's Date: 03/27/2020    History of present illness 44 yo male presenting with acute left sided weakness. Given tPA at 2315 on 03/25/20.  MRI pending. Recently diagnosed with COVID-19 starting 03/07/20. PMH including asthma.    OT comments  Pt progressing towards established OT goals. Providing education on FM coordination handout; pt verbalized and demonstrated understanding. Pt continues to present with decreased coordination and awareness of LUE. During donning of shoes, pt with decreased initiation of LUE and poor in-hand manipulation requiring multiple attempts to tie shoes. Continue to recommend dc to home once medically stable and will continue to follow acutely as admitted.   Follow Up Recommendations  No OT follow up;Supervision - Intermittent    Equipment Recommendations  None recommended by OT    Recommendations for Other Services PT consult    Precautions / Restrictions Precautions Precautions: Fall Precaution Comments: mildly unsteady Restrictions Weight Bearing Restrictions: No       Mobility Bed Mobility Overal bed mobility: Independent                Transfers Overall transfer level: Needs assistance Equipment used: None Transfers: Sit to/from Stand Sit to Stand: Supervision         General transfer comment: Supervision for safety    Balance Overall balance assessment: No apparent balance deficits (not formally assessed)                                         ADL either performed or assessed with clinical judgement   ADL Overall ADL's : Needs assistance/impaired                     Lower Body Dressing: Supervision/safety;Sit to/from stand Lower Body Dressing Details (indicate cue type and reason): Pt donning his shoes. Noting LUE with decreased coorindation and grasp. While tying shoes, pt requiring mutiple  attempts              Functional mobility during ADLs: Supervision/safety General ADL Comments: Focused session on homd exercises program for LUE. Pt performing functional mobility in hallway with Supervision     Vision       Perception     Praxis      Cognition Arousal/Alertness: Awake/alert Behavior During Therapy: [facility] for tasks assessed/performed Overall Cognitive Status: Within Functional Limits for tasks assessed                                 General Comments: Feel pt near baseline cognition. Following commands, able to recall ST memory words, and performing simple path finding skills.         Exercises Exercises: Hand exercises;Other exercises Hand Exercises Digit Composite Flexion: AROM;5 reps;Left;Seated Composite Extension: AROM;Left;5 reps;Seated Digit Lifts: AROM;Left;10 reps;Seated Opposition: AROM;Left;10 reps;Seated Other Exercises Other Exercises: Reviewed FM handout; pt verbalized understanding. Other Exercises: Educating pt nad wife and neuroplasticity and importance of pt performing FM tasks    Shoulder Instructions       General Comments Reviewed BEFAST with pt and wife    Pertinent Vitals/ Pain       Pain Assessment: No/denies pain Faces Pain Scale: No hurt  Home Living  Prior Functioning/Environment              Frequency  Min 2X/week        Progress Toward Goals  OT Goals(current goals can now be found in the care plan section)  Progress towards OT goals: Progressing toward goals  Acute Rehab OT Goals Patient Stated Goal: Return home, today if possible OT Goal Formulation: With patient Time For Goal Achievement: 04/09/20 Potential to Achieve Goals: Good ADL Goals Additional ADL Goal #1: Pt will perform ADLs at independent level Additional ADL Goal #2: Pt will demonstrate understanding of FM exercises with supervision  Plan Discharge plan remains  appropriate    Co-evaluation                 AM-PAC OT "6 Clicks" Daily Activity     Outcome Measure   Help from another person eating meals?: None Help from another person taking care of personal grooming?: A Little Help from another person toileting, which includes using toliet, bedpan, or urinal?: A Little Help from another person bathing (including washing, rinsing, drying)?: A Little Help from another person to put on and taking off regular upper body clothing?: A Little Help from another person to put on and taking off regular lower body clothing?: A Little 6 Click Score: 19    End of Session    OT Visit Diagnosis: Unsteadiness on feet (R26.81);Other abnormalities of gait and mobility (R26.89);Muscle weakness (generalized) (M62.81)   Activity Tolerance Patient tolerated treatment well   Patient Left with call bell/phone within reach;in bed   Nurse Communication Mobility status        Time: 5170-0174 OT Time Calculation (min): 28 min  Charges: OT General Charges $OT Visit: 1 Visit OT Treatments $Self Care/Home Management : 8-22 mins $Therapeutic Activity: 8-22 mins  [place], OTR/L Acute Rehab Pager: [phone] Office: [place] 03/27/2020, 11:41 AM

## 2020-03-27 NOTE — Progress Notes (Signed)
  Echocardiogram Echocardiogram Transesophageal has been performed.  Kyle Ware A Jeanny Rymer 03/27/2020, 1:38 PM

## 2020-03-30 NOTE — Progress Notes (Signed)
Primary Physician/Referring:Sethi, Lucy Antigua, MD [address] [place],  [place] [address]   Patient ID: Kyle Ware, male    DOB: 06-Nov-1976, 44 y.o.   MRN: [id number]  Chief Complaint  Patient presents with  . New Patient (Initial Visit)  . patent foramen ovale   HPI:    Kyle Ware  is a 44 y.o. Caucasian male referred by Dr. Leonie Man, patient with no significant prior cardiovascular history, he had not seen a physician in a while, presented to the emergency room via EMS on 04/28 for acute left sided weakness, treated with IV tPA with excellent neurologic recovery. UVO:[id number] acute ischemia within the right middle and posterior cerebral artery territories.  He is now referred to me for consideration of PFO closure and also evaluation for any atrial arrhythmias specifically atrial fibrillation.  Patient's cardiac risk factors now include history of tobacco use disorder, mixed hyperlipidemia.  States that except for tingling or numbness in his left arm, he has pretty much recovered well from stroke.  Still smoking about a pack of cigarettes a day.  Wife is present at the bedside.    Past Medical History:  Diagnosis Date  . Asthma   . Stroke [facility]) 2021   Past Surgical History:  Procedure Laterality Date  . BUBBLE STUDY  03/27/2020   Procedure: BUBBLE STUDY;  Surgeon: Donato Heinz, MD;  Location: [facility];  Service: Cardiovascular;;  . TEE WITHOUT CARDIOVERSION N/A 03/27/2020   Procedure: TRANSESOPHAGEAL ECHOCARDIOGRAM (TEE);  Surgeon: Donato Heinz, MD;  Location: [facility] ENDOSCOPY;  Service: Cardiovascular;  Laterality: N/A;   Family History  Problem Relation Age of Onset  . Hypertension Sister   . Hypertension Brother     Social History   Tobacco Use  . Smoking status: Current Every Day Smoker  . Smokeless tobacco: Never Used  Substance Use Topics  . Alcohol use: Not Currently    Alcohol/week: 6.0 standard drinks    Types: 6 Shots  of liquor per week    Comment: occasional (monthly)   Marital Status: Married  ROS  Review of Systems  Cardiovascular: Negative for chest pain, dyspnea on exertion and leg swelling.  Hematologic/Lymphatic: Does not bruise/bleed easily.  Gastrointestinal: Negative for melena.  Neurological: Positive for numbness (left hand) and paresthesias.   Objective  Blood pressure 108/71, pulse 78, temperature 98 F (36.7 C), temperature source Temporal, resp. rate 15, height 5\' 8"  (1.727 m), weight 166 lb (75.3 kg), SpO2 99 %.  Vitals with BMI 03/31/2020 03/27/2020 03/27/2020  Height 5\' 8"  - -  Weight 166 lbs - -  BMI 42.59 - -  Systolic 563 875 643  Diastolic 71 81 86  Pulse 78 64 71     Physical Exam  Cardiovascular: Normal rate, regular rhythm, normal heart sounds and intact distal pulses. Exam reveals no gallop.  No murmur heard. No leg edema, no JVD.  Pulmonary/Chest: Effort normal and breath sounds normal.  Abdominal: Soft. Bowel sounds are normal.   Laboratory examination:   Recent Labs    04/24/19 0824 04/24/19 0824 03/25/20 2304 03/25/20 2310 03/27/20 0239  NA 136   < > 128* 134* 138  K 4.2   < > 3.5 4.5 3.7  CL 100   < > 95* 100 107  CO2 25  --  22  --  24  GLUCOSE 130*   < > 122* 128* 102*  BUN 14   < > 22* 31* 12  CREATININE 1.34*   < >  1.45* 1.40* 1.02  CALCIUM 8.7*  --  8.4*  --  8.3*  GFRNONAA >60  --  58*  --  >60  GFRAA >60  --  >60  --  >60   < > = values in this interval not displayed.   estimated creatinine clearance is 88.5 mL/min (by C-G formula based on SCr of 1.02 mg/dL).  CMP Latest Ref Rng & Units 03/27/2020 03/25/2020 03/25/2020  Glucose 70 - 99 mg/dL 993(Z) 169(C) 789(F)  BUN 6 - 20 mg/dL 12 81(O) 17(P)  Creatinine 0.61 - 1.24 mg/dL 1.02 5.85(I) 7.78(E)  Sodium 135 - 145 mmol/L 138 134(L) 128(L)  Potassium 3.5 - 5.1 mmol/L 3.7 4.5 3.5  Chloride 98 - 111 mmol/L 107 100 95(L)  CO2 22 - 32 mmol/L 24 - 22  Calcium 8.9 - 10.3 mg/dL 8.3(L) - 8.4(L)   Total Protein 6.5 - 8.1 g/dL - - 6.4(L)  Total Bilirubin 0.3 - 1.2 mg/dL - - 0.5  Alkaline Phos 38 - 126 U/L - - 49  AST 15 - 41 U/L - - 16  ALT 0 - 44 U/L - - 11   CBC Latest Ref Rng & Units 03/27/2020 03/26/2020 03/25/2020  WBC 4.0 - 10.5 K/uL 7.3 14.4(H) -  Hemoglobin 13.0 - 17.0 g/dL 4.2(P) 10.2(L) 12.9(L)  Hematocrit 39.0 - 52.0 % 32.0(L) 34.5(L) 38.0(L)  Platelets 150 - 400 K/uL 303 357 -   Lipid Panel     Component Value Date/Time   CHOL 273 (H) 03/26/2020 0317   TRIG 410 (H) 03/26/2020 0317   HDL 37 (L) 03/26/2020 0317   CHOLHDL 7.4 03/26/2020 0317   VLDL UNABLE TO CALCULATE IF TRIGLYCERIDE OVER 400 mg/dL 53/61/4431 5400   LDLCALC UNABLE TO CALCULATE IF TRIGLYCERIDE OVER 400 mg/dL 86/76/1950 9326   LDLDIRECT 122.2 (H) 03/26/2020 0317   HEMOGLOBIN A1C Lab Results  Component Value Date   HGBA1C 5.7 (H) 03/26/2020   MPG 116.89 03/26/2020   TSH No results for input(s): TSH in the last 8760 hours.  Medications and allergies  No Known Allergies   Meds ordered this encounter  Medications  . nicotine (NICODERM CQ) 21 mg/24hr patch    Sig: Place 1 patch (21 mg total) onto the skin daily.    Dispense:  28 patch    Refill:  0  . nicotine (NICODERM CQ) 14 mg/24hr patch    Sig: Place 1 patch (14 mg total) onto the skin daily. Start after 24 mg used up    Dispense:  28 patch    Refill:  0   Radiology:   CT Head 03/25/2020: 1. Negative head CT.  No acute intracranial abnormality identified. 2. ASPECTS is 10.   CTA Head & Neck 03/25/2020: 1. Negative CTA for large vessel occlusion. 2. Atheromatous plaque about the right carotid bifurcation/proximal right ICA with associated stenosis of up to 50% by NASCET criteria. 3. Otherwise wide patency of the major arterial vasculature of the head and neck. No other hemodynamically significant or correctable stenosis. No other significant atheromatous disease for age. 4. Mild tree-in-bud reticular densities within the peripheral  right upper lobe, nonspecific, but suspicious for mild pneumonitis/small airways disease.  MRI Brain without contrast 03/25/2020: 1. Multifocal acute ischemia within the right middle and posterior cerebral artery territories. Given the presence of a right P-comm, this pattern is compatible with emboli from the right carotid system. 2. No hemorrhage or mass effect.  Cardiac Studies:   Echocardiogram 03/25/2020: 1. Left ventricular ejection fraction, by estimation,  is 65 to 70%. The left ventricle has normal function. The left ventricle has no regional wall motion abnormalities. Left ventricular diastolic parameters are consistent with Grade I diastolic  dysfunction (impaired relaxation).  2. Right ventricular systolic function is normal. The right ventricular size is normal.  3. The mitral valve is normal in structure. No evidence of mitral valve regurgitation. No evidence of mitral stenosis.  4. The aortic valve is normal in structure. Aortic valve regurgitation is not visualized. No aortic stenosis is present.  5. The inferior vena cava is normal in size with greater than 50% respiratory variability, suggesting right atrial pressure of 3 mmHg.   Lower Venous DVT Study 03/26/2020: RIGHT:  - There is no evidence of deep vein thrombosis in the lower extremity.  - No cystic structure found in the popliteal fossa.  LEFT:  - There is no evidence of deep vein thrombosis in the lower extremity.  - No cystic structure found in the popliteal fossa.    Echocardiogram TEE 03/27/2020: 1. Left ventricular ejection fraction, by estimation, is 60 to 65%. The left ventricle has normal function. The left ventricle has no regional wall motion abnormalities.  2. Right ventricular systolic function is normal. The right ventricular size is normal.  3. No left atrial/left atrial appendage thrombus was detected.  4. The mitral valve is grossly normal. Trivial mitral valve regurgitation.  5. The aortic valve  is tricuspid. Aortic valve regurgitation is not visualized. No aortic stenosis is present.  6. Technically difficult study. Patient had significant gag reflex which limited exam. Sedation was increased to suppress gag reflex, but patient began desaturating and probe was withdrawn. Transgastric views were not obtained.  7. Agitated saline contrast bubble study was positive with shunting observed within 3-6 cardiac cycles suggestive of interatrial shunt.   EKG  EKG 03/31/2020: Normal sinus rhythm with rate of 72 bpm, normal axis. IRBBB.  No evidence of ischemia, normal EKG. No significant change from 03/25/2020:   Assessment     ICD-10-CM   1. Cerebrovascular accident (CVA) due to embolism of right middle cerebral artery (HCC)  I63.411 EKG 12-Lead    CARDIAC EVENT MONITOR   Right MCA branch, right caudate and right occipital embolic infarcts of cryptogenic etiology s/p IV TPA with excellent clinical recovery  2. Patent foramen ovale  Q21.1   3. Mixed hyperlipidemia  E78.2   4. Tobacco use disorder  F17.200 nicotine (NICODERM CQ) 21 mg/24hr patch    nicotine (NICODERM CQ) 14 mg/24hr patch     Meds ordered this encounter  Medications  . nicotine (NICODERM CQ) 21 mg/24hr patch    Sig: Place 1 patch (21 mg total) onto the skin daily.    Dispense:  28 patch    Refill:  0  . nicotine (NICODERM CQ) 14 mg/24hr patch    Sig: Place 1 patch (14 mg total) onto the skin daily. Start after 24 mg used up    Dispense:  28 patch    Refill:  0    There are no discontinued medications.  Recommendations:   Kyle Ware  is a 44 y.o. Caucasian male with no significant prior cardiovascular history, he had not seen a physician in a while, presented to the emergency room via EMS on 04/28 for acute left sided weakness, treated with IV tPA with excellent neurologic recovery. FXT:[id number] acute ischemia within the right middle and posterior cerebral artery territories.  He is now referred to me for  consideration of PFO closure and  also evaluation for any atrial arrhythmias specifically atrial fibrillation.  Patient's cardiac risk factors now include history of tobacco use disorder, mixed hyperlipidemia.  I have reviewed his medical records and the available test results and also TEE independently reviewed. I am not completely convinced about the PFO as the images are sub optimal. I will probably repeat TEE unless DrMarland Kitchen Pearlean Brownie can do TCB bubble study to confirm intracardiac shunting.  Atrial fibrillation needs to be excluded, I will perform event monitor for 2 weeks.  I discussed with patient about mixed hyperlipidemia.  [facility] diet (low glycemic) sheet given to the patient to help with TG elevation.  He will need lipid profile testing along with CBC and CMP, which I will arrange prior to his PFO closure/future procedure.  They have made diet changes already. I also discussed extensively regarding smoking cessation, he appears to be motivated but is not ready to quit yet.  I have sent in for NicoDerm patches.    I would like to see him back in 4 weeks for follow-up.  Yates Decamp, MD, [facility] 03/31/2020, 5:57 PM [facility] [place] Pager: [phone] Office: [phone]

## 2020-03-31 ENCOUNTER — Encounter: Payer: Self-pay | Admitting: Cardiology

## 2020-03-31 ENCOUNTER — Encounter: Payer: Self-pay | Admitting: *Deleted

## 2020-03-31 ENCOUNTER — Ambulatory Visit: Payer: Federal, State, Local not specified - PPO | Admitting: Cardiology

## 2020-03-31 ENCOUNTER — Other Ambulatory Visit: Payer: Self-pay | Admitting: *Deleted

## 2020-03-31 ENCOUNTER — Ambulatory Visit: Payer: Federal, State, Local not specified - PPO

## 2020-03-31 ENCOUNTER — Other Ambulatory Visit: Payer: Self-pay

## 2020-03-31 VITALS — BP 108/71 | HR 78 | Temp 98.0°F | Resp 15 | Ht 68.0 in | Wt 166.0 lb

## 2020-03-31 DIAGNOSIS — I63411 Cerebral infarction due to embolism of right middle cerebral artery: Secondary | ICD-10-CM

## 2020-03-31 DIAGNOSIS — F17200 Nicotine dependence, unspecified, uncomplicated: Secondary | ICD-10-CM

## 2020-03-31 DIAGNOSIS — Q2112 Patent foramen ovale: Secondary | ICD-10-CM

## 2020-03-31 DIAGNOSIS — E785 Hyperlipidemia, unspecified: Secondary | ICD-10-CM

## 2020-03-31 DIAGNOSIS — E782 Mixed hyperlipidemia: Secondary | ICD-10-CM

## 2020-03-31 DIAGNOSIS — Q211 Atrial septal defect: Secondary | ICD-10-CM

## 2020-03-31 MED ORDER — NICOTINE 14 MG/24HR TD PT24: 14.0000 mg | MEDICATED_PATCH | Freq: Every day | TRANSDERMAL | 0 refills | Status: DC

## 2020-03-31 MED ORDER — NICOTINE 21 MG/24HR TD PT24: 21.0000 mg | MEDICATED_PATCH | Freq: Every day | TRANSDERMAL | 0 refills | Status: DC

## 2020-03-31 NOTE — Patient Outreach (Signed)
[facility]) Care Management  03/31/2020  Kyle Ware [birth year] [id number]   EMMI- stroke Not on APL discharged from [facility] RED ON EMMI ALERT Day # 1 Date: Sunday 03/29/20 1301  Red Alert Reason: scheduled follow up appointment? 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 His wife Kyle Land "[name]" reports she called about "eight" different providers on 03/30/20 and the earliest appointment offered was around the second week in June 2021 [facility] RN CM discussed the importance of following up or establishing care with a primary care provider (PCP) within 1-2  weeks after a hospital discharge for follow up care His wife reports Kyle Tamer has FMLA forms that need to be completed and she will start by asking for assistance from the neurologist then cardiologist  [facility] RN CM offered to assist with finding a pcp but his wife reports she will secure a primary care provider for him and call [facility] RN CM prn      Social: Kyle Ware is a 44 year old [employer] who lives at home His wife Kyle Land "[name] is supportive He works in [place] the [place].  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 Patient gave verbal consent for services [facility] telephonic RN CM.   Advised patient that there will be further automated EMMI- post discharge calls to assess how the patient is doing following the recent hospitalization Advised the patient that another call may be received from a nurse if any of their responses were abnormal. Patient voiced understanding and was appreciative of f/u call.   Plan: [facility] RN CM will close case at this time as patient has been assessed and no needs identified/needs resolved. His wife reports she will secure a primary care provider for him and call [facility] RN CM prn     Pt encouraged to return a call to [facility] RN CM prn  [facility] RN CM sent a successful outreach letter as discussed with [facility] brochure enclosed for review   Winnifred Dufford L. [name], RN, BSN, CCM [facility] Telephonic Care Management Care Coordinator Office number [phone] Mobile number [phone]  Main [facility] number  [phone] Fax number [phone]

## 2020-04-01 NOTE — Progress Notes (Signed)
Jay  TCD Bubble study HITS at rest: mild  HITS during Valsalva: moderate, partial curtain sign    Evidence of medium size PFO   Positive TCD Bubble study indicative of medium size right to left shunt  *See table(s) above for TCD measurements and observations.     Diagnosing physician: Delia Heady MD  Electronically signed by Delia Heady MD on 03/30/2020 at 8:30:42 AM.

## 2020-04-07 ENCOUNTER — Other Ambulatory Visit: Payer: Self-pay | Admitting: *Deleted

## 2020-04-07 NOTE — Patient Outreach (Signed)
[facility]) Care Management  04/07/2020  Kyle Ware 28-Oct-1976 [id number]   EMMI- stroke Not on APL discharged from [place] Day # 1 Date: Sunday 03/29/20 1301  Red Alert Reason: scheduled follow up appointment? no   And   RED ON EMMI ALERT Day #9 Date Monday 04/06/20 1301 Red Alert Reason: Feeling worse overall? Yes New problems walking/talking/speaking/seeing? 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   [facility] admissions x 1  ED visits x 1 in the last 6 months    Outreach attempt # 2 was successful at [phone] Patient is able to verify HIPAA, DOB and address [place] Management RN reviewed and addressed red alertwith patient   EMMI:  Kyle Ware reports he followed up with Kyle Ware cardiologist 03/31/20 and was able to establish care with Kyle Ware of [facility] He had an office visit with Kyle Ware on 04/02/20 (2 days after the last [facility] outreach)   Feeling worse/new problem -He reports  worsening nose bleed for "four hours" He reports he has a history of nosebleeds that his other family members (grandfather, brother, uncle, etc). also have. He reports he has had multiple procedures as a teenager to include cauterization but still continues with nose bleeds from time to time. He states his nose is not bleeding today. He denies problems with walking, talking, speaking nor seeing. He reports he feels better today He reports not going to ED but would have if he had not been able to stop the bleeding  [facility] RN CM and Kyle Ware discussed his use of anticoagulants (aspirin and Plavix) related to his nosebleeds [facility] RN CM sent EMMI educational materials for nose bleeds to the listed e-mail address in EPIC   Social: Kyle Ware is a 44 year old [employer] who lives at home His wife Kyle Ware "Kyle Ware is supportive He works in [place] near the airport.  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 s/p IV TPA with excellent clinical recovery Patent foramen ovale (PFO), asthma , Hyperlipidemia, smoker kidneys stone 04/24/19   DME: home cardiac Holter monitor to paroxysmal Atrial fibrillation and further evaluation/treatment of PFO   Medications: He denies concerns with taking medications as prescribed, affording medications, side effects of medications and questions about medications    Appointments: 04/30/20 4 week CVA f/u Kyle Ware & will also see Kyle Ware   Advance Directives:  Denies need for assist with advance directives     Consent: [facility] RN CM reviewed [facility] services with patient. Patient gave verbal consent for services [facility] telephonic RN CM.   Advised patient that there will be further automated EMMI-post discharge calls to assess how the patient is doing following the recent hospitalization Advised the patient that another call may be received from a nurse if any of their responses were abnormal. Patient voiced understanding and was appreciative of f/u call.   Plan: [facility] RN CM will close case at this time as patient has been assessed and no needs identified/needs resolved. He has a new pcp and is aware of how to stop his nose bleeds  Pt encouraged to return a call to [facility] RN CM prn  [facility] RN CM discussed the [facility] brochure and magnet sent for review of the 24 hour nurse line  Routed note to MD  Kyle Bradford L. [name], RN, BSN, CCM [facility] Telephonic Care Management Care Coordinator Office number [phone] Mobile number [phone]  Main [facility] number [phone] Fax number [phone]

## 2020-04-15 ENCOUNTER — Other Ambulatory Visit: Payer: Self-pay | Admitting: *Deleted

## 2020-04-15 NOTE — Patient Outreach (Signed)
[facility]) Care Management  04/15/2020  Kyle Ware 08-Oct-1976 [id number]  [facility] RN CM update for EMMI- stroke Not on APL discharged from [facility] RED ON EMMI ALERT Day #1 Date:Sunday 03/29/20 1301 Red Alert Reason:scheduled follow up appointment? no   And   RED ON EMMI ALERT Day #9 Date Monday 04/06/20 1301 Red Alert Reason: Feeling worse overall? Yes New problems walking/talking/speaking/seeing? Yes  And   RED ON EMMI ALERT Day #13 Date Friday 04/10/20 1000 Red Alert ReasonWent to follow-up appointment? 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   [name] L. [name], RN, BSN, CCM [facility] Telephonic Care Management Care Coordinator Office number [phone] Mobile number [phone]  Main [facility] number [phone] Fax number [phone]

## 2020-04-29 NOTE — H&P (View-Only) (Signed)
Primary Physician/Referring:No referring provider defined for this encounter.   Patient ID: Kyle Ware, male    DOB: 12-Aug-1976, 44 y.o.   MRN: [id number]  Chief Complaint  Patient presents with  . Cerebrovascular Accident  . Follow-up    4 week   HPI:    Kyle Ware  is a 44 y.o. Caucasian male referred by Dr. Pearlean Brownie, patient with no significant prior cardiovascular history, he had not seen a physician in a while, presented to the emergency room via EMS on 04/28 for acute left sided weakness, treated with IV tPA with excellent neurologic recovery. WUJ:[id number] acute ischemia within the right middle and posterior cerebral artery territories.  He is now referred to me for consideration of PFO closure and also evaluation for any atrial arrhythmias specifically atrial fibrillation.   He underwent event monitoring which did not reveal any episodes of atrial fibrillation.  He has reduced smoking cigarettes to 1/2 pack of cigarettes a day now.  Patient's cardiac risk factors now include history of tobacco use disorder, mixed hyperlipidemia.  States that except for tingling or numbness in his left arm, he has pretty much recovered well from stroke. Wife is present at the bedside.    Past Medical History:  Diagnosis Date  . Asthma   . Hyperlipidemia   . PFO (patent foramen ovale)   . Stroke [facility]) 2021   Past Surgical History:  Procedure Laterality Date  . BUBBLE STUDY  03/27/2020   Procedure: BUBBLE STUDY;  Surgeon: Little Ishikawa, MD;  Location: [facility] ENDOSCOPY;  Service: Cardiovascular;;  . TEE WITHOUT CARDIOVERSION N/A 03/27/2020   Procedure: TRANSESOPHAGEAL ECHOCARDIOGRAM (TEE);  Surgeon: Little Ishikawa, MD;  Location: [facility] ENDOSCOPY;  Service: Cardiovascular;  Laterality: N/A;   Family History  Problem Relation Age of Onset  . Hypertension Sister   . Hypertension Brother     Social History   Tobacco Use  . Smoking status: Current Every Day Smoker     Packs/day: 0.50  . Smokeless tobacco: Never Used  Substance Use Topics  . Alcohol use: Not Currently    Alcohol/week: 6.0 standard drinks    Types: 6 Shots of liquor per week    Comment: occasional (monthly)   Marital Status: Married  ROS  Review of Systems  Cardiovascular: Negative for chest pain, dyspnea on exertion and leg swelling.  Hematologic/Lymphatic: Does not bruise/bleed easily.  Gastrointestinal: Negative for melena.  Neurological: Positive for numbness (left hand) and paresthesias.   Objective  Blood pressure 115/70, pulse 83, resp. rate 17, height 5\' 8"  (1.727 m), weight 167 lb (75.8 kg), SpO2 100 %.  Vitals with BMI 04/30/2020 03/31/2020 03/27/2020  Height 5\' 8"  5\' 8"  -  Weight 167 lbs 166 lbs -  BMI 25.4 25.25 -  Systolic 115 108 03/29/2020  Diastolic 70 71 81  Pulse 83 78 64     Physical Exam  Cardiovascular: Normal rate, regular rhythm, normal heart sounds and intact distal pulses. Exam reveals no gallop.  No murmur heard. No leg edema, no JVD.  Pulmonary/Chest: Effort normal and breath sounds normal.  Abdominal: Soft. Bowel sounds are normal.   Laboratory examination:   Recent Labs    03/25/20 2304 03/25/20 2310 03/27/20 0239  NA 128* 134* 138  K 3.5 4.5 3.7  CL 95* 100 107  CO2 22  --  24  GLUCOSE 122* 128* 102*  BUN 22* 31* 12  CREATININE 1.45* 1.40* 1.02  CALCIUM 8.4*  --  8.3*  GFRNONAA  58*  --  >60  GFRAA >60  --  >60   CrCl cannot be calculated (Patient's most recent lab result is older than the maximum 21 days allowed.).  CMP Latest Ref Rng & Units 03/27/2020 03/25/2020 03/25/2020  Glucose 70 - 99 mg/dL 102(H) 128(H) 122(H)  BUN 6 - 20 mg/dL 12 31(H) 22(H)  Creatinine 0.61 - 1.24 mg/dL 1.02 1.40(H) 1.45(H)  Sodium 135 - 145 mmol/L 138 134(L) 128(L)  Potassium 3.5 - 5.1 mmol/L 3.7 4.5 3.5  Chloride 98 - 111 mmol/L 107 100 95(L)  CO2 22 - 32 mmol/L 24 - 22  Calcium 8.9 - 10.3 mg/dL 8.3(L) - 8.4(L)  Total Protein 6.5 - 8.1 g/dL - - 6.4(L)  Total  Bilirubin 0.3 - 1.2 mg/dL - - 0.5  Alkaline Phos 38 - 126 U/L - - 49  AST 15 - 41 U/L - - 16  ALT 0 - 44 U/L - - 11   CBC Latest Ref Rng & Units 03/27/2020 03/26/2020 03/25/2020  WBC 4.0 - 10.5 K/uL 7.3 14.4(H) -  Hemoglobin 13.0 - 17.0 g/dL 9.4(L) 10.2(L) 12.9(L)  Hematocrit 39.0 - 52.0 % 32.0(L) 34.5(L) 38.0(L)  Platelets 150 - 400 K/uL 303 357 -   Lipid Panel Recent Labs    03/26/20 0317  CHOL 273*  TRIG 410*  LDLCALC UNABLE TO CALCULATE IF TRIGLYCERIDE OVER 400 mg/dL  VLDL UNABLE TO CALCULATE IF TRIGLYCERIDE OVER 400 mg/dL  HDL 37*  CHOLHDL 7.4  LDLDIRECT 122.2*    HEMOGLOBIN A1C Lab Results  Component Value Date   HGBA1C 5.7 (H) 03/26/2020   MPG 116.89 03/26/2020   TSH No results for input(s): TSH in the last 8760 hours.    Medications and allergies  No Known Allergies   No orders of the defined types were placed in this encounter.  Radiology:   CT Head 03/25/2020: 1. Negative head CT.  No acute intracranial abnormality identified. 2. ASPECTS is 10.   CTA Head & Neck 03/25/2020: 1. Negative CTA for large vessel occlusion. 2. Atheromatous plaque about the right carotid bifurcation/proximal right ICA with associated stenosis of up to 50% by NASCET criteria. 3. Otherwise wide patency of the major arterial vasculature of the head and neck. No other hemodynamically significant or correctable stenosis. No other significant atheromatous disease for age. 4. Mild tree-in-bud reticular densities within the peripheral right upper lobe, nonspecific, but suspicious for mild pneumonitis/small airways disease.  MRI Brain without contrast 03/25/2020: 1. Multifocal acute ischemia within the right middle and posterior cerebral artery territories. Given the presence of a right P-comm, this pattern is compatible with emboli from the right carotid system. 2. No hemorrhage or mass effect.  Cardiac Studies:   Echocardiogram 03/25/2020: 1. Left ventricular ejection fraction, by  estimation, is 65 to 70%. The left ventricle has normal function. The left ventricle has no regional wall motion abnormalities. Left ventricular diastolic parameters are consistent with Grade I diastolic  dysfunction (impaired relaxation).  2. Right ventricular systolic function is normal. The right ventricular size is normal.  3. The mitral valve is normal in structure. No evidence of mitral valve regurgitation. No evidence of mitral stenosis.  4. The aortic valve is normal in structure. Aortic valve regurgitation is not visualized. No aortic stenosis is present.  5. The inferior vena cava is normal in size with greater than 50% respiratory variability, suggesting right atrial pressure of 3 mmHg.   Lower Venous DVT Study 03/26/2020: RIGHT:  - There is no evidence of deep vein  thrombosis in the lower extremity.  - No cystic structure found in the popliteal fossa.  LEFT:  - There is no evidence of deep vein thrombosis in the lower extremity.  - No cystic structure found in the popliteal fossa.    Echocardiogram TEE 03/27/2020: 1. Left ventricular ejection fraction, by estimation, is 60 to 65%. The left ventricle has normal function. The left ventricle has no regional wall motion abnormalities.  2. Right ventricular systolic function is normal. The right ventricular size is normal.  3. No left atrial/left atrial appendage thrombus was detected.  4. The mitral valve is grossly normal. Trivial mitral valve regurgitation.  5. The aortic valve is tricuspid. Aortic valve regurgitation is not visualized. No aortic stenosis is present.  6. Technically difficult study. Patient had significant gag reflex which limited exam. Sedation was increased to suppress gag reflex, but patient began desaturating and probe was withdrawn. Transgastric views were not obtained.  7. Agitated saline contrast bubble study was positive with shunting observed within 3-6 cardiac cycles suggestive of interatrial shunt.    EKG  03/31/2020: Normal sinus rhythm with rate of 72 bpm, normal axis. IRBBB.  No evidence of ischemia, normal EKG. No significant change from 03/25/2020:   Assessment     ICD-10-CM   1. Cerebrovascular accident (CVA) due to embolism of right middle cerebral artery (HCC)  I63.411   2. Patent foramen ovale  Q21.1       Outpatient Encounter Medications as of 04/30/2020  Medication Sig  . aspirin EC 81 MG EC tablet Take 1 tablet (81 mg total) by mouth daily.  Marland Kitchen atorvastatin (LIPITOR) 80 MG tablet Take 1 tablet (80 mg total) by mouth daily at 6 PM.  . CHANTIX STARTING MONTH PAK 0.5 MG X 11 & 1 MG X 42 tablet Take 0.5 mg by mouth daily.  . clopidogrel (PLAVIX) 75 MG tablet Take 75 mg by mouth daily.  . [DISCONTINUED] nicotine (NICODERM CQ) 14 mg/24hr patch Place 1 patch (14 mg total) onto the skin daily. Start after 24 mg used up (Patient not taking: Reported on 04/30/2020)  . [DISCONTINUED] nicotine (NICODERM CQ) 21 mg/24hr patch Place 1 patch (21 mg total) onto the skin daily. (Patient not taking: Reported on 04/30/2020)   No facility-administered encounter medications on file as of 04/30/2020.    Recommendations:   Kyle Ware  is a 44 y.o. Caucasian male with no significant prior cardiovascular history, he had not seen a physician in a while, presented to the emergency room via EMS on 04/28 for acute left sided weakness, treated with IV tPA with excellent neurologic recovery. [id number] acute ischemia within the right middle and posterior cerebral artery territories.  He was seen by me for consideration of PFO closure and also evaluation for any atrial arrhythmias specifically atrial fibrillation.  I reviewed all the data with the patient and his wife again including TCD bubble study suggestive of PFO or intracardiac shunting and paradoxical stroke.  We discussed regarding surgical closure versus percutaneous closure and all the risks associated with percutaneous closure including  less than 1% risk of temporary arrhythmias but rarely permanent arrhythmias especially atrial arrhythmias, cardiac perforation, need for emergent surgical intervention, stroke.  All questions were discussed in detail with the patient and his wife and all questions answered to their satisfaction.  Also offered surgical consultation.  Patient wants to proceed with percutaneous closure.  Smoking cessation was discussed again.  With regard to labs, he just had his complete physical examination today  by Shane Anderson, FNP and labs have been ordered.  I will follow up on this prior to PFO closure for stability for the procedure.  I will see him back in 4 weeks following PFO repair.  Branna Cortina, MD, FACC 04/30/2020, 6:34 PM [facility] Pager: [phone] Office: [phone]  CC: Pramod Sethi, MD  

## 2020-04-29 NOTE — Progress Notes (Signed)
Primary Physician/Referring:No referring provider defined for this encounter.   Patient ID: Kyle Ware, male    DOB: 12-Aug-1976, 44 y.o.   MRN: [id number]  Chief Complaint  Patient presents with  . Cerebrovascular Accident  . Follow-up    4 week   HPI:    Kyle Ware  is a 44 y.o. Caucasian male referred by Dr. Pearlean Brownie, patient with no significant prior cardiovascular history, he had not seen a physician in a while, presented to the emergency room via EMS on 04/28 for acute left sided weakness, treated with IV tPA with excellent neurologic recovery. WUJ:[id number] acute ischemia within the right middle and posterior cerebral artery territories.  He is now referred to me for consideration of PFO closure and also evaluation for any atrial arrhythmias specifically atrial fibrillation.   He underwent event monitoring which did not reveal any episodes of atrial fibrillation.  He has reduced smoking cigarettes to 1/2 pack of cigarettes a day now.  Patient's cardiac risk factors now include history of tobacco use disorder, mixed hyperlipidemia.  States that except for tingling or numbness in his left arm, he has pretty much recovered well from stroke. Wife is present at the bedside.    Past Medical History:  Diagnosis Date  . Asthma   . Hyperlipidemia   . PFO (patent foramen ovale)   . Stroke [facility]) 2021   Past Surgical History:  Procedure Laterality Date  . BUBBLE STUDY  03/27/2020   Procedure: BUBBLE STUDY;  Surgeon: Little Ishikawa, MD;  Location: [facility] ENDOSCOPY;  Service: Cardiovascular;;  . TEE WITHOUT CARDIOVERSION N/A 03/27/2020   Procedure: TRANSESOPHAGEAL ECHOCARDIOGRAM (TEE);  Surgeon: Little Ishikawa, MD;  Location: [facility] ENDOSCOPY;  Service: Cardiovascular;  Laterality: N/A;   Family History  Problem Relation Age of Onset  . Hypertension Sister   . Hypertension Brother     Social History   Tobacco Use  . Smoking status: Current Every Day Smoker     Packs/day: 0.50  . Smokeless tobacco: Never Used  Substance Use Topics  . Alcohol use: Not Currently    Alcohol/week: 6.0 standard drinks    Types: 6 Shots of liquor per week    Comment: occasional (monthly)   Marital Status: Married  ROS  Review of Systems  Cardiovascular: Negative for chest pain, dyspnea on exertion and leg swelling.  Hematologic/Lymphatic: Does not bruise/bleed easily.  Gastrointestinal: Negative for melena.  Neurological: Positive for numbness (left hand) and paresthesias.   Objective  Blood pressure 115/70, pulse 83, resp. rate 17, height 5\' 8"  (1.727 m), weight 167 lb (75.8 kg), SpO2 100 %.  Vitals with BMI 04/30/2020 03/31/2020 03/27/2020  Height 5\' 8"  5\' 8"  -  Weight 167 lbs 166 lbs -  BMI 25.4 25.25 -  Systolic 115 108 03/29/2020  Diastolic 70 71 81  Pulse 83 78 64     Physical Exam  Cardiovascular: Normal rate, regular rhythm, normal heart sounds and intact distal pulses. Exam reveals no gallop.  No murmur heard. No leg edema, no JVD.  Pulmonary/Chest: Effort normal and breath sounds normal.  Abdominal: Soft. Bowel sounds are normal.   Laboratory examination:   Recent Labs    03/25/20 2304 03/25/20 2310 03/27/20 0239  NA 128* 134* 138  K 3.5 4.5 3.7  CL 95* 100 107  CO2 22  --  24  GLUCOSE 122* 128* 102*  BUN 22* 31* 12  CREATININE 1.45* 1.40* 1.02  CALCIUM 8.4*  --  8.3*  GFRNONAA  58*  --  >60  GFRAA >60  --  >60   CrCl cannot be calculated (Patient's most recent lab result is older than the maximum 21 days allowed.).  CMP Latest Ref Rng & Units 03/27/2020 03/25/2020 03/25/2020  Glucose 70 - 99 mg/dL 102(H) 128(H) 122(H)  BUN 6 - 20 mg/dL 12 31(H) 22(H)  Creatinine 0.61 - 1.24 mg/dL 1.02 1.40(H) 1.45(H)  Sodium 135 - 145 mmol/L 138 134(L) 128(L)  Potassium 3.5 - 5.1 mmol/L 3.7 4.5 3.5  Chloride 98 - 111 mmol/L 107 100 95(L)  CO2 22 - 32 mmol/L 24 - 22  Calcium 8.9 - 10.3 mg/dL 8.3(L) - 8.4(L)  Total Protein 6.5 - 8.1 g/dL - - 6.4(L)  Total  Bilirubin 0.3 - 1.2 mg/dL - - 0.5  Alkaline Phos 38 - 126 U/L - - 49  AST 15 - 41 U/L - - 16  ALT 0 - 44 U/L - - 11   CBC Latest Ref Rng & Units 03/27/2020 03/26/2020 03/25/2020  WBC 4.0 - 10.5 K/uL 7.3 14.4(H) -  Hemoglobin 13.0 - 17.0 g/dL 9.4(L) 10.2(L) 12.9(L)  Hematocrit 39.0 - 52.0 % 32.0(L) 34.5(L) 38.0(L)  Platelets 150 - 400 K/uL 303 357 -   Lipid Panel Recent Labs    03/26/20 0317  CHOL 273*  TRIG 410*  LDLCALC UNABLE TO CALCULATE IF TRIGLYCERIDE OVER 400 mg/dL  VLDL UNABLE TO CALCULATE IF TRIGLYCERIDE OVER 400 mg/dL  HDL 37*  CHOLHDL 7.4  LDLDIRECT 122.2*    HEMOGLOBIN A1C Lab Results  Component Value Date   HGBA1C 5.7 (H) 03/26/2020   MPG 116.89 03/26/2020   TSH No results for input(s): TSH in the last 8760 hours.    Medications and allergies  No Known Allergies   No orders of the defined types were placed in this encounter.  Radiology:   CT Head 03/25/2020: 1. Negative head CT.  No acute intracranial abnormality identified. 2. ASPECTS is 10.   CTA Head & Neck 03/25/2020: 1. Negative CTA for large vessel occlusion. 2. Atheromatous plaque about the right carotid bifurcation/proximal right ICA with associated stenosis of up to 50% by NASCET criteria. 3. Otherwise wide patency of the major arterial vasculature of the head and neck. No other hemodynamically significant or correctable stenosis. No other significant atheromatous disease for age. 4. Mild tree-in-bud reticular densities within the peripheral right upper lobe, nonspecific, but suspicious for mild pneumonitis/small airways disease.  MRI Brain without contrast 03/25/2020: 1. Multifocal acute ischemia within the right middle and posterior cerebral artery territories. Given the presence of a right P-comm, this pattern is compatible with emboli from the right carotid system. 2. No hemorrhage or mass effect.  Cardiac Studies:   Echocardiogram 03/25/2020: 1. Left ventricular ejection fraction, by  estimation, is 65 to 70%. The left ventricle has normal function. The left ventricle has no regional wall motion abnormalities. Left ventricular diastolic parameters are consistent with Grade I diastolic  dysfunction (impaired relaxation).  2. Right ventricular systolic function is normal. The right ventricular size is normal.  3. The mitral valve is normal in structure. No evidence of mitral valve regurgitation. No evidence of mitral stenosis.  4. The aortic valve is normal in structure. Aortic valve regurgitation is not visualized. No aortic stenosis is present.  5. The inferior vena cava is normal in size with greater than 50% respiratory variability, suggesting right atrial pressure of 3 mmHg.   Lower Venous DVT Study 03/26/2020: RIGHT:  - There is no evidence of deep vein  thrombosis in the lower extremity.  - No cystic structure found in the popliteal fossa.  LEFT:  - There is no evidence of deep vein thrombosis in the lower extremity.  - No cystic structure found in the popliteal fossa.    Echocardiogram TEE 03/27/2020: 1. Left ventricular ejection fraction, by estimation, is 60 to 65%. The left ventricle has normal function. The left ventricle has no regional wall motion abnormalities.  2. Right ventricular systolic function is normal. The right ventricular size is normal.  3. No left atrial/left atrial appendage thrombus was detected.  4. The mitral valve is grossly normal. Trivial mitral valve regurgitation.  5. The aortic valve is tricuspid. Aortic valve regurgitation is not visualized. No aortic stenosis is present.  6. Technically difficult study. Patient had significant gag reflex which limited exam. Sedation was increased to suppress gag reflex, but patient began desaturating and probe was withdrawn. Transgastric views were not obtained.  7. Agitated saline contrast bubble study was positive with shunting observed within 3-6 cardiac cycles suggestive of interatrial shunt.    EKG  03/31/2020: Normal sinus rhythm with rate of 72 bpm, normal axis. IRBBB.  No evidence of ischemia, normal EKG. No significant change from 03/25/2020:   Assessment     ICD-10-CM   1. Cerebrovascular accident (CVA) due to embolism of right middle cerebral artery (HCC)  I63.411   2. Patent foramen ovale  Q21.1       Outpatient Encounter Medications as of 04/30/2020  Medication Sig  . aspirin EC 81 MG EC tablet Take 1 tablet (81 mg total) by mouth daily.  Marland Kitchen atorvastatin (LIPITOR) 80 MG tablet Take 1 tablet (80 mg total) by mouth daily at 6 PM.  . CHANTIX STARTING MONTH PAK 0.5 MG X 11 & 1 MG X 42 tablet Take 0.5 mg by mouth daily.  . clopidogrel (PLAVIX) 75 MG tablet Take 75 mg by mouth daily.  . [DISCONTINUED] nicotine (NICODERM CQ) 14 mg/24hr patch Place 1 patch (14 mg total) onto the skin daily. Start after 24 mg used up (Patient not taking: Reported on 04/30/2020)  . [DISCONTINUED] nicotine (NICODERM CQ) 21 mg/24hr patch Place 1 patch (21 mg total) onto the skin daily. (Patient not taking: Reported on 04/30/2020)   No facility-administered encounter medications on file as of 04/30/2020.    Recommendations:   Kyle Ware  is a 44 y.o. Caucasian male with no significant prior cardiovascular history, he had not seen a physician in a while, presented to the emergency room via EMS on 04/28 for acute left sided weakness, treated with IV tPA with excellent neurologic recovery. YNW:[id number] acute ischemia within the right middle and posterior cerebral artery territories.  He was seen by me for consideration of PFO closure and also evaluation for any atrial arrhythmias specifically atrial fibrillation.  I reviewed all the data with the patient and his wife again including TCD bubble study suggestive of PFO or intracardiac shunting and paradoxical stroke.  We discussed regarding surgical closure versus percutaneous closure and all the risks associated with percutaneous closure including  less than 1% risk of temporary arrhythmias but rarely permanent arrhythmias especially atrial arrhythmias, cardiac perforation, need for emergent surgical intervention, stroke.  All questions were discussed in detail with the patient and his wife and all questions answered to their satisfaction.  Also offered surgical consultation.  Patient wants to proceed with percutaneous closure.  Smoking cessation was discussed again.  With regard to labs, he just had his complete physical examination today  by Azzie Roup, FNP and labs have been ordered.  I will follow up on this prior to PFO closure for stability for the procedure.  I will see him back in 4 weeks following PFO repair.  Yates Decamp, MD, [facility] 04/30/2020, 6:34 PM [facility] [place] Pager: [phone] Office: [phone]  CC: Delia Heady, MD

## 2020-04-30 ENCOUNTER — Ambulatory Visit: Payer: Federal, State, Local not specified - PPO | Admitting: Cardiology

## 2020-04-30 ENCOUNTER — Other Ambulatory Visit: Payer: Self-pay

## 2020-04-30 ENCOUNTER — Encounter: Payer: Self-pay | Admitting: Cardiology

## 2020-04-30 VITALS — BP 115/70 | HR 83 | Resp 17 | Ht 68.0 in | Wt 167.0 lb

## 2020-04-30 DIAGNOSIS — Q2112 Patent foramen ovale: Secondary | ICD-10-CM

## 2020-04-30 DIAGNOSIS — I63411 Cerebral infarction due to embolism of right middle cerebral artery: Secondary | ICD-10-CM

## 2020-05-01 ENCOUNTER — Observation Stay (HOSPITAL_COMMUNITY)
Admission: EM | Admit: 2020-05-01 | Discharge: 2020-05-02 | Disposition: A | Discharge status: Home / Self Care | Payer: Federal, State, Local not specified - PPO | Attending: Internal Medicine | Admitting: Internal Medicine

## 2020-05-01 ENCOUNTER — Other Ambulatory Visit: Payer: Self-pay

## 2020-05-01 ENCOUNTER — Encounter (HOSPITAL_COMMUNITY): Payer: Self-pay | Admitting: *Deleted

## 2020-05-01 DIAGNOSIS — E559 Vitamin D deficiency, unspecified: Secondary | ICD-10-CM | POA: Diagnosis not present

## 2020-05-01 DIAGNOSIS — Z20822 Contact with and (suspected) exposure to covid-19: Secondary | ICD-10-CM | POA: Diagnosis not present

## 2020-05-01 DIAGNOSIS — Q211 Atrial septal defect: Secondary | ICD-10-CM | POA: Diagnosis not present

## 2020-05-01 DIAGNOSIS — D649 Anemia, unspecified: Secondary | ICD-10-CM | POA: Diagnosis not present

## 2020-05-01 DIAGNOSIS — F17210 Nicotine dependence, cigarettes, uncomplicated: Secondary | ICD-10-CM | POA: Diagnosis not present

## 2020-05-01 DIAGNOSIS — Z8249 Family history of ischemic heart disease and other diseases of the circulatory system: Secondary | ICD-10-CM | POA: Insufficient documentation

## 2020-05-01 DIAGNOSIS — Z8673 Personal history of transient ischemic attack (TIA), and cerebral infarction without residual deficits: Secondary | ICD-10-CM | POA: Diagnosis not present

## 2020-05-01 DIAGNOSIS — Z7982 Long term (current) use of aspirin: Secondary | ICD-10-CM | POA: Diagnosis not present

## 2020-05-01 DIAGNOSIS — J45909 Unspecified asthma, uncomplicated: Secondary | ICD-10-CM | POA: Insufficient documentation

## 2020-05-01 DIAGNOSIS — Z79899 Other long term (current) drug therapy: Secondary | ICD-10-CM | POA: Insufficient documentation

## 2020-05-01 DIAGNOSIS — D500 Iron deficiency anemia secondary to blood loss (chronic): Secondary | ICD-10-CM | POA: Diagnosis present

## 2020-05-01 DIAGNOSIS — R040 Epistaxis: Secondary | ICD-10-CM | POA: Insufficient documentation

## 2020-05-01 DIAGNOSIS — D62 Acute posthemorrhagic anemia: Secondary | ICD-10-CM | POA: Diagnosis not present

## 2020-05-01 DIAGNOSIS — E785 Hyperlipidemia, unspecified: Secondary | ICD-10-CM | POA: Insufficient documentation

## 2020-05-01 HISTORY — DX: Anemia, unspecified: D64.9

## 2020-05-01 HISTORY — DX: Personal history of urinary calculi: Z87.442

## 2020-05-01 LAB — TYPE AND SCREEN
ABO/RH(D): O NEG
Antibody Screen: NEGATIVE
Weak D: POSITIVE

## 2020-05-01 LAB — CBC
HCT: 18.2 % — ABNORMAL LOW (ref 39.0–52.0)
Hemoglobin: 4.9 g/dL — CL (ref 13.0–17.0)
MCH: 21.3 pg — ABNORMAL LOW (ref 26.0–34.0)
MCHC: 26.9 g/dL — ABNORMAL LOW (ref 30.0–36.0)
MCV: 79.1 fL — ABNORMAL LOW (ref 80.0–100.0)
Platelets: 270 10*3/uL (ref 150–400)
RBC: 2.3 MIL/uL — ABNORMAL LOW (ref 4.22–5.81)
RDW: 17.7 % — ABNORMAL HIGH (ref 11.5–15.5)
WBC: 8.9 10*3/uL (ref 4.0–10.5)
nRBC: 0.3 % — ABNORMAL HIGH (ref 0.0–0.2)

## 2020-05-01 LAB — COMPREHENSIVE METABOLIC PANEL
ALT: 29 U/L (ref 0–44)
AST: 24 U/L (ref 15–41)
Albumin: 3.8 g/dL (ref 3.5–5.0)
Alkaline Phosphatase: 65 U/L (ref 38–126)
Anion gap: 11 (ref 5–15)
BUN: 15 mg/dL (ref 6–20)
CO2: 25 mmol/L (ref 22–32)
Calcium: 8.7 mg/dL — ABNORMAL LOW (ref 8.9–10.3)
Chloride: 100 mmol/L (ref 98–111)
Creatinine, Ser: 1.1 mg/dL (ref 0.61–1.24)
GFR calc Af Amer: >60 mL/min (ref >60)
GFR calc non Af Amer: >60 mL/min (ref >60)
Glucose, Bld: 136 mg/dL — ABNORMAL HIGH (ref 70–99)
Potassium: 3.7 mmol/L (ref 3.5–5.1)
Sodium: 136 mmol/L (ref 135–145)
Total Bilirubin: 0.3 mg/dL (ref 0.3–1.2)
Total Protein: 6.6 g/dL (ref 6.5–8.1)

## 2020-05-01 LAB — IRON AND TIBC
Iron: 11 ug/dL — ABNORMAL LOW (ref 45–182)
Saturation Ratios: 2 % — ABNORMAL LOW (ref 17.9–39.5)
TIBC: 592 ug/dL — ABNORMAL HIGH (ref 250–450)
UIBC: 581 ug/dL

## 2020-05-01 LAB — ABO/RH: ABO/RH(D): O NEG

## 2020-05-01 LAB — PREPARE RBC (CROSSMATCH)

## 2020-05-01 LAB — PROTIME-INR
INR: 1 (ref 0.8–1.2)
Prothrombin Time: 13.1 seconds (ref 11.4–15.2)

## 2020-05-01 LAB — POC OCCULT BLOOD, ED: Fecal Occult Bld: NEGATIVE

## 2020-05-01 LAB — RETIC PANEL
Immature Retic Fract: 16 % — ABNORMAL HIGH (ref 2.3–15.9)
RBC.: 2.38 MIL/uL — ABNORMAL LOW (ref 4.22–5.81)
Retic Count, Absolute: 76 10*3/uL (ref 19.0–186.0)
Retic Ct Pct: 3.2 % — ABNORMAL HIGH (ref 0.4–3.1)
Reticulocyte Hemoglobin: 17.5 pg — ABNORMAL LOW (ref >27.9)

## 2020-05-01 LAB — FERRITIN: Ferritin: 2 ng/mL — ABNORMAL LOW (ref 24–336)

## 2020-05-01 LAB — SARS CORONAVIRUS 2 BY RT PCR (HOSPITAL ORDER, PERFORMED IN ~~LOC~~ HOSPITAL LAB): SARS Coronavirus 2: NEGATIVE

## 2020-05-01 MED ORDER — NICOTINE 14 MG/24HR TD PT24
14.0000 mg | MEDICATED_PATCH | Freq: Every day | TRANSDERMAL | Status: DC
  Filled 2020-05-01: qty 1

## 2020-05-01 MED ORDER — SODIUM CHLORIDE 0.9% IV SOLUTION: Freq: Once | INTRAVENOUS | Status: DC

## 2020-05-01 MED ORDER — SODIUM CHLORIDE 0.9 % IV SOLN
510.0000 mg | Freq: Once | INTRAVENOUS | Status: AC
  Administered 2020-05-01: 510 mg via INTRAVENOUS
  Filled 2020-05-01: qty 17

## 2020-05-01 MED ORDER — ATORVASTATIN CALCIUM 80 MG PO TABS
80.0000 mg | ORAL_TABLET | Freq: Every day | ORAL | Status: DC
  Administered 2020-05-01: 80 mg via ORAL
  Filled 2020-05-01: qty 1

## 2020-05-01 MED ORDER — SODIUM CHLORIDE 0.9 % IV SOLN: 10.0000 mL/h | Freq: Once | INTRAVENOUS | Status: DC

## 2020-05-01 MED ORDER — ACETAMINOPHEN 325 MG PO TABS: 650.0000 mg | ORAL_TABLET | Freq: Four times a day (QID) | ORAL | Status: DC | PRN

## 2020-05-01 MED ORDER — ACETAMINOPHEN 650 MG RE SUPP: 650.0000 mg | Freq: Four times a day (QID) | RECTAL | Status: DC | PRN

## 2020-05-01 MED ORDER — OXYMETAZOLINE HCL 0.05 % NA SOLN
1.0000 | NASAL | Status: DC | PRN
  Filled 2020-05-01: qty 30

## 2020-05-01 NOTE — ED Triage Notes (Signed)
Pt reports recent stroke 1 month ago and was on blood thinners, unsure of which one. Pt went to pcp for blood work and sent here due to low hgb.

## 2020-05-01 NOTE — H&P (Addendum)
Date: 05/01/2020               Patient Name:  Kyle Ware MRN: [id number]  DOB: 04/02/1976 Age / Sex: 44 y.o., male   PCP: Maudie Flakes, FNP         Medical Service: Internal Medicine Teaching Service         Attending Physician: Dr. Earl Lagos, MD    First Contact: Augustine Radar, MS3 Pager: [phone]  Second Contact: Dr. Mcarthur Rossetti Pager: [phone]       After Hours (After 5p/  First Contact Pager: [phone]  weekends / holidays): Second Contact Pager: [phone]   Chief Complaint: "my blood is low"  History of Present Illness: Pt is a 43-yr-old man with PMH of stroke, PFO, HLD, and frequent nosebleeds who presents to the hospital with low Hgb. Pt visited his cardiologist yesterday for stroke follow up. His cardiologist noted that his hemoglobin was dangerously low and informed the pt that he needed to go to the hospital for a blood transfusion. Pt is feeling well overall. Endorses fatigue but states this has been ongoing for 2 years. Denies fevers, chills, chest pain, SOB, headache, dizziness, craving for non-food items, nausea, vomiting, and abdominal pain.  In the ED, CBC, glucose, PT/INR, and CMP were ordered. Hgb 4.9, MCV 79.1. Glucose 136. Labs otherwise unremarkable. 2 units of pRBCs was given to the patient.  Aspirin has been discontinued during his stay in the hospital.  Meds: Takes as below. Placed on Plavix and aspirin after stroke 1 month ago. Pt has completed his course of Plavix. Current Meds  Medication Sig  . aspirin EC 81 MG EC tablet Take 1 tablet (81 mg total) by mouth daily.  Marland Kitchen atorvastatin (LIPITOR) 80 MG tablet Take 1 tablet (80 mg total) by mouth daily at 6 PM.  . CHANTIX STARTING MONTH PAK 0.5 MG X 11 & 1 MG X 42 tablet Take 0.5 mg by mouth daily.     Allergies: NKDA Allergies as of 05/01/2020  . (No Known Allergies)   Past Medical History:  Diagnosis Date  . Asthma   . Hyperlipidemia   . PFO (patent foramen ovale)   . Stroke [facility]) 2021     Family History: nosebleeds on his father's side of the family (numerous paternal family members, excluding father). Negative for diabetes, HTN, cancer  Social History: positive for cigarette smoking (smokes 8 cigarettes a day, previously a pack a day). On Chantix to cut back on smoking. Negative for EtOH and illicit drug use. Works in a factory. Married, wife present in room  Review of Systems: A complete ROS was negative except as per HPI.   Physical Exam: Blood pressure 112/73, pulse 74, temperature 98.5 F (36.9 C), resp. rate 15, height 5\' 7"  (1.702 m), weight 76 kg, SpO2 100 %. General: Pt is well-appearing and in NAD HEENT: [place]/AT. Grossly EOMI. Conjunctival pallor is present Cardiac: RRR. Normal S1, S2. No murmurs, rubs, or gallops Respiratory: Lungs are CTA BL. No wheezes, rales, or rhonchi GI: Abdomen is soft, non-tender, and non-distended with normoactive bowel sounds MSK: Spontaneously moving all extremities Neuro: Pt is awake and alert Skin: Warm and dry. No ecchymoses noted. Skin pallor present Psych: Affect and mood are normal  CBC    Component Value Date/Time   WBC 8.9 05/01/2020 1017   RBC 2.38 (L) 05/01/2020 1451   RBC 2.30 (L) 05/01/2020 1017   HGB 4.9 (LL) 05/01/2020 1017   HCT 18.2 (L)  05/01/2020 1017   PLT 270 05/01/2020 1017   MCV 79.1 (L) 05/01/2020 1017   MCH 21.3 (L) 05/01/2020 1017   MCHC 26.9 (L) 05/01/2020 1017   RDW 17.7 (H) 05/01/2020 1017   LYMPHSABS 0.9 03/25/2020 2304   MONOABS 1.5 (H) 03/25/2020 2304   EOSABS 0.0 03/25/2020 2304   BASOSABS 0.0 03/25/2020 2304   CMP     Component Value Date/Time   NA 136 05/01/2020 1017   K 3.7 05/01/2020 1017   CL 100 05/01/2020 1017   CO2 25 05/01/2020 1017   GLUCOSE 136 (H) 05/01/2020 1017   BUN 15 05/01/2020 1017   CREATININE 1.10 05/01/2020 1017   CALCIUM 8.7 (L) 05/01/2020 1017   PROT 6.6 05/01/2020 1017   ALBUMIN 3.8 05/01/2020 1017   AST 24 05/01/2020 1017   ALT 29 05/01/2020 1017    ALKPHOS 65 05/01/2020 1017   BILITOT 0.3 05/01/2020 1017   GFRNONAA >60 05/01/2020 1017   GFRAA >60 05/01/2020 1017   Prothrombin Time 11.4 - 15.2 seconds 13.1   INR 0.8 - 1.2 1.0    Fecal Occult Bld NEGATIVE NEGATIVE     Assessment & Plan by Problem: Active Problems:   Anemia due to blood loss, chronic  Pt is a 59-yr-old man with PMH significant for stroke, PFO, HLD, and frequent nosebleeds who presents to the ED with low Hgb and is receiving 2 units of pRBCs. Pt is not symptomatic.  Anemia due to blood loss: Pt is asymptomatic. PMH is significant for recurrent nosebleeds since childhood, which may be cause of anemia. Conjunctival pallor noted on exam. Differential for anemia includes occult GI blood loss and epistaxis. MCV is low, so consider thalassemias in differential as well, though less likely given extensive family history suggesting AD inheritance pattern. Receiving blood transfusion today. -2 units pRBCs -Check iron labs -Check reticulocyte index -Recheck CBC after blood transfusion -Transfuse for Hgb < 7 as needed  Chronic epistaxis: Differential includes Osler-Weber-Rendu syndrome, VWD, hemophilia, and JNA. Extensive FH history of nosebleeds on father's side of the family suggests autosomal dominant inheritance pattern.  -Follow up hematology outpatient -Afrin 1 spray each nare  History of CVA: Pt had stroke 1 month ago that was treated with IV tPa with excellent recovery. Pt feels he has recovered from stroke, no neurological sequelae noted. Pt has completed course of Plavix -Resume aspirin outpatient -Follow up cardiology outpatient   Tobacco use disorder: Pt has cut back his cigarette use from a pack a day to 8 cigarettes a day. On Chantix -Continue Chantix -Encourage smoking cessation  HLD: -Atorvastatin 80 mg  FEN/GI: Diet: Regular Fluids: None Electrolytes: Monitor and replete prn  DVT prophylaxis: Lovenox contraindicated due to possible active  bleed. -SCDs  CODE STATUS: FULL  Dispo: Admit patient to Observation with expected length of stay less than 2 midnights.  Signed: Josepha Pigg, Medical Student 05/01/2020, 2:35 PM  Pager: [phone]   Attestation for Student Documentation:  I personally was present and performed or re-performed the history, physical exam and medical decision-making activities of this service and have verified that the service and findings are accurately documented in the student's note.  Harvie Heck, MD  Internal Medicine, PGY-1 05/01/2020, 4:04 PM

## 2020-05-01 NOTE — ED Provider Notes (Signed)
[facility] EMERGENCY DEPARTMENT Provider Note   CSN: [id number] Arrival date & time: 05/01/20  5361     History Chief Complaint  Patient presents with  . Abnormal Lab    Kyle Ware is a 44 y.o. male.  He was recently admitted last month for a stroke that left him with some residual tingling in his left arm.  They also started him on some blood thinners.  Here today after visiting cardiologist yesterday and getting blood work done.  They called him and told him he was extremely anemic.  He denies any complaints.  Has noticed some dark stools intermittently.  No dizziness lightheadedness chest pain shortness of breath fevers chills abdominal pain.  Infrequent nosebleeds.  No prior history of transfusions. No fever or chills  The history is provided by the patient.  Abnormal Lab Time since result:  Yesterday Patient referred by:  Specialist Result type: hematology   Hematology:    Hematocrit:  Low   Hemoglobin:  Low      Past Medical History:  Diagnosis Date  . Asthma   . Hyperlipidemia   . PFO (patent foramen ovale)   . Stroke [facility]) 2021    Patient Active Problem List   Diagnosis Date Noted  . 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 BUBBLE STUDY  03/27/2020   Procedure: BUBBLE STUDY;  Surgeon: Little Ishikawa, MD;  Location: [facility] ENDOSCOPY;  Service: Cardiovascular;;  . TEE WITHOUT CARDIOVERSION N/A 03/27/2020   Procedure: TRANSESOPHAGEAL ECHOCARDIOGRAM (TEE);  Surgeon: Little Ishikawa, MD;  Location: [facility] ENDOSCOPY;  Service: Cardiovascular;  Laterality: N/A;       Family History  Problem Relation Age of Onset  . Hypertension Sister   . Hypertension Brother     Social History   Tobacco Use  . Smoking status: Current Every Day Smoker    Packs/day: 0.50  . Smokeless tobacco: Never Used  Substance Use Topics  . Alcohol use: Not Currently    Alcohol/week: 6.0 standard  drinks    Types: 6 Shots of liquor per week    Comment: occasional (monthly)  . Drug use: Not Currently    Home Medications Prior to Admission medications   Medication Sig Start Date End Date Taking? Authorizing Provider  aspirin EC 81 MG EC tablet Take 1 tablet (81 mg total) by mouth daily. 03/27/20  Yes Bhuvaneswaran, Anitha, PA-C  atorvastatin (LIPITOR) 80 MG tablet Take 1 tablet (80 mg total) by mouth daily at 6 PM. 03/27/20  Yes Bhuvaneswaran, Anitha, PA-C  CHANTIX STARTING MONTH PAK 0.5 MG X 11 & 1 MG X 42 tablet Take 0.5 mg by mouth daily. 04/02/20  Yes [provider]  clopidogrel (PLAVIX) 75 MG tablet Take 75 mg by mouth daily. 03/27/20   [provider]    Allergies    Patient has no known allergies.  Review of Systems   Review of Systems  Constitutional: Negative for fever.  HENT: Negative for sore throat.   Eyes: Negative for visual disturbance.  Respiratory: Negative for shortness of breath.   Cardiovascular: Negative for chest pain.  Gastrointestinal: Negative for abdominal pain, nausea and vomiting.  Genitourinary: Negative for dysuria.  Musculoskeletal: Negative for neck pain.  Skin: Negative for rash.  Neurological: Negative for weakness.    Physical Exam Updated Vital Signs BP 128/62   Pulse 97   Temp 98.7 F (37.1 C)   Resp 16   Ht  5\' 7"  (1.702 m)   Wt 76 kg   SpO2 100%   BMI 26.23 kg/m   Physical Exam Vitals and nursing note reviewed.  Constitutional:      Appearance: Normal appearance. He is well-developed.  HENT:     Head: Normocephalic and atraumatic.  Eyes:     Comments: Pale conjunctiva  Cardiovascular:     Rate and Rhythm: Normal rate and regular rhythm.     Heart sounds: No murmur.  Pulmonary:     Effort: Pulmonary effort is normal. No respiratory distress.     Breath sounds: Normal breath sounds.  Abdominal:     Palpations: Abdomen is soft.     Tenderness: There is no abdominal tenderness.  Musculoskeletal:         General: No deformity or signs of injury. Normal range of motion.     Cervical back: Neck supple.  Skin:    General: Skin is warm and dry.     Capillary Refill: Capillary refill takes less than 2 seconds.     Coloration: Skin is pale.  Neurological:     Mental Status: He is alert. Mental status is at baseline.     ED Results / Procedures / Treatments   Labs (all labs ordered are listed, but only abnormal results are displayed) Labs Reviewed  COMPREHENSIVE METABOLIC PANEL - Abnormal; Notable for the following components:      Result Value   Glucose, Bld 136 (*)    Calcium 8.7 (*)    All other components within normal limits  CBC - Abnormal; Notable for the following components:   RBC 2.30 (*)    Hemoglobin 4.9 (*)    HCT 18.2 (*)    MCV 79.1 (*)    MCH 21.3 (*)    MCHC 26.9 (*)    RDW 17.7 (*)    nRBC 0.3 (*)    All other components within normal limits  FERRITIN - Abnormal; Notable for the following components:   Ferritin 2 (*)    All other components within normal limits  IRON AND TIBC - Abnormal; Notable for the following components:   Iron 11 (*)    TIBC 592 (*)    Saturation Ratios 2 (*)    All other components within normal limits  RETIC PANEL - Abnormal; Notable for the following components:   Retic Ct Pct 3.2 (*)    RBC. 2.38 (*)    Immature Retic Fract 16.0 (*)    Reticulocyte Hemoglobin 17.5 (*)    All other components within normal limits  SARS CORONAVIRUS 2 BY RT PCR (HOSPITAL ORDER, [facility])  PROTIME-INR  BASIC METABOLIC PANEL  CBC  APTT  POC OCCULT BLOOD, ED  TYPE AND SCREEN  ABO/RH  PREPARE RBC (CROSSMATCH)  TYPE AND SCREEN    EKG EKG Interpretation  Date/Time:  Friday May 01 2020 09:51:09 EDT Ventricular Rate:  96 PR Interval:  126 QRS Duration: 92 QT Interval:  374 QTC Calculation: 472 R Axis:   68 Text Interpretation: Normal sinus rhythm Low voltage QRS Borderline ECG No significant change since prior  4/21 Confirmed by Aletta Edouard [id number]) on 05/01/2020 11:56:11 AM   Radiology No results found.  Procedures .Critical Care Performed by: Hayden Rasmussen, MD Authorized by: Hayden Rasmussen, MD   Critical care provider statement:    Critical care time (minutes):  45   Critical care time was exclusive of:  Separately billable procedures and treating other patients  Critical care was necessary to treat or prevent imminent or life-threatening deterioration of the following conditions:  Circulatory failure   Critical care was time spent personally by me on the following activities:  Discussions with consultants, evaluation of patient's response to treatment, examination of patient, ordering and performing treatments and interventions, ordering and review of laboratory studies, ordering and review of radiographic studies, pulse oximetry, re-evaluation of patient's condition, obtaining history from patient or surrogate, review of old charts and development of treatment plan with patient or surrogate   I assumed direction of critical care for this patient from another provider in my specialty: no     (including critical care time)  Medications Ordered in ED Medications  acetaminophen (TYLENOL) tablet 650 mg (has no administration in time range)    Or  acetaminophen (TYLENOL) suppository 650 mg (has no administration in time range)  nicotine (NICODERM CQ - dosed in mg/24 hours) patch 14 mg (14 mg Transdermal Not Given 05/01/20 1735)  atorvastatin (LIPITOR) tablet 80 mg (80 mg Oral Given 05/01/20 1734)  ferumoxytol (FERAHEME) 510 mg in sodium chloride 0.9 % 100 mL IVPB (has no administration in time range)  0.9 %  sodium chloride infusion (Manually program via Guardrails IV Fluids) (has no administration in time range)  oxymetazoline (AFRIN) 0.05 % nasal spray 1 spray (has no administration in time range)    ED Course  I have reviewed the triage vital signs and the nursing notes.  Pertinent  labs & imaging results that were available during my care of the patient were reviewed by me and considered in my medical decision making (see chart for details).  Clinical Course as of May 01 1929  Fri May 01, 2020  1209 Rectal exam done with nurse as chaperone.  Normal sphincter tone no masses.  Guaiac sent to lab.   [MB]  1258 Discussed with internal medicine team who will evaluate the patient for admission.   [MB]    Clinical Course User Index [MB] Terrilee Files, MD   MDM Rules/Calculators/A&P                     This patient complains of anemia; this involves an extensive number of treatment Options and is a complaint that carries with it a high risk of complications and Morbidity. The differential includes symptomatic anemia, bone marrow suppression, GI bleed, metabolic derangement, lab error  I ordered, reviewed and interpreted labs, which included CBC which showing a critically low hemoglobin of 4.9.  Chemistries unremarkable other than a mildly elevated glucose. I ordered medication packed red blood cells Additional history obtained from patient's wife Previous records obtained and reviewed in epic including last admission for stroke I consulted internal medicine team and discussed lab and imaging findings  Critical Interventions: Confirmation of acute anemia and initiation of transfusion  After the interventions stated above, I reevaluated the patient and found patient to be hemodynamically stable.  Will need admission for transfusion and further work-up   Final Clinical Impression(s) / ED Diagnoses Final diagnoses:  Symptomatic anemia    Rx / DC Orders ED Discharge Orders    None       Terrilee Files, MD 05/01/20 1934

## 2020-05-02 DIAGNOSIS — D649 Anemia, unspecified: Secondary | ICD-10-CM | POA: Diagnosis not present

## 2020-05-02 DIAGNOSIS — D500 Iron deficiency anemia secondary to blood loss (chronic): Secondary | ICD-10-CM

## 2020-05-02 LAB — BPAM RBC
Blood Product Expiration Date: 202106122359
Blood Product Expiration Date: 202106222359
ISSUE DATE / TIME: 202106041506
ISSUE DATE / TIME: 202106041809
Unit Type and Rh: 9500
Unit Type and Rh: 9500

## 2020-05-02 LAB — TYPE AND SCREEN
ABO/RH(D): O NEG
Antibody Screen: NEGATIVE
Unit division: 0
Unit division: 0
Weak D: POSITIVE

## 2020-05-02 LAB — APTT: aPTT: 29 seconds (ref 24–36)

## 2020-05-02 LAB — BASIC METABOLIC PANEL
Anion gap: 8 (ref 5–15)
BUN: 10 mg/dL (ref 6–20)
CO2: 25 mmol/L (ref 22–32)
Calcium: 8.5 mg/dL — ABNORMAL LOW (ref 8.9–10.3)
Chloride: 106 mmol/L (ref 98–111)
Creatinine, Ser: 1.06 mg/dL (ref 0.61–1.24)
GFR calc Af Amer: >60 mL/min (ref >60)
GFR calc non Af Amer: >60 mL/min (ref >60)
Glucose, Bld: 86 mg/dL (ref 70–99)
Potassium: 3.6 mmol/L (ref 3.5–5.1)
Sodium: 139 mmol/L (ref 135–145)

## 2020-05-02 LAB — CBC
HCT: 24.9 % — ABNORMAL LOW (ref 39.0–52.0)
Hemoglobin: 7.6 g/dL — ABNORMAL LOW (ref 13.0–17.0)
MCH: 23.8 pg — ABNORMAL LOW (ref 26.0–34.0)
MCHC: 30.5 g/dL (ref 30.0–36.0)
MCV: 77.8 fL — ABNORMAL LOW (ref 80.0–100.0)
Platelets: 263 10*3/uL (ref 150–400)
RBC: 3.2 MIL/uL — ABNORMAL LOW (ref 4.22–5.81)
RDW: 16.2 % — ABNORMAL HIGH (ref 11.5–15.5)
WBC: 11.4 10*3/uL — ABNORMAL HIGH (ref 4.0–10.5)
nRBC: 0.3 % — ABNORMAL HIGH (ref 0.0–0.2)

## 2020-05-02 MED ORDER — FERROUS SULFATE 325 (65 FE) MG PO TBEC: 325.0000 mg | DELAYED_RELEASE_TABLET | Freq: Two times a day (BID) | ORAL | 0 refills | Status: AC

## 2020-05-02 MED ORDER — VITAMIN D (ERGOCALCIFEROL) 1.25 MG (50000 UNIT) PO CAPS: 50000.0000 [IU] | ORAL_CAPSULE | ORAL | 0 refills | Status: AC

## 2020-05-02 NOTE — Progress Notes (Signed)
  Date: 05/02/2020  Patient name: Kyle Ware  Medical record number: [id number]  Date of birth: [birth year]   I have seen and evaluated Kyle Ware and discussed their care with the Residency Team.  In brief, patient is a 44 year old male with past medical history of CVA, PFO, hyperlipidemia and frequent epistaxis who presented to the ED with anemia diagnosed as an outpatient.  Patient followed up with his cardiologist on the day prior to his admission and had blood work done which showed severely low hemoglobin and was sent to the ED for further evaluation.  Patient states that he feels well except for some mild fatigue but states that this has been ongoing for couple years.  No fevers or chills, no chest pain, no shortness of breath, no palpitations, no diaphoresis, no lightheadedness, no syncope, no hematemesis, no bright red blood per rectum.  In the ED his hemoglobin was noted to be 4.9 and had a ferritin of 2 and an iron level of 11.  Today patient states that he feels well and denies any new complaints and would like to go home.  He denies any active bleeding.  PMHx, Fam Hx, and/or Soc Hx : As per resident admit note  Vitals:   05/02/20 0339 05/02/20 0949  BP: (!) 94/59 137/87  Pulse: 72 69  Resp: 16 18  Temp: 98.3 F (36.8 C)   SpO2: 98% 100%   General: Awake, alert, oriented x3, NAD CVS: Regular rhythm, normal heart sounds Lungs: CTA bilaterally Abdomen: Soft, nontender, nondistended, normoactive bowel sounds Extremities: No edema noted Psych: Normal mood and affect HEENT: Normocephalic, atraumatic Skin: Warm and dry  Assessment and Plan: I have seen and evaluated the patient as outlined above. I agree with the formulated Assessment and Plan as detailed in the residents' note, with the following changes:   1.  Acute blood loss anemia secondary to epistaxis: -Patient has a history of recurrent epistaxis and was found to be severely anemic at his cardiologist  office and was referred to the ED for further evaluation.  On his lab work he was noted to have microcytic anemia with a hemoglobin of 4.9 and iron deficiency with a ferritin of 2 and a serum iron of 11. -The etiology behind his recurrent epistaxis remains uncertain but appears to be hereditary on his father side.  Patient may benefit from outpatient follow-up with hematology or geneticist for further evaluation for hereditary disease. -No current epistaxis.  Patient will need follow-up with ENT as an outpatient as well -Patient is status post 2 units PRBC with improvement in hemoglobin to 7.6 -Patient also received Feraheme during this admission for severe deficiency anemia.  Will need to continue with oral iron tablets at home -No further work-up at this time -Patient stable for DC home today.  Earl Lagos, MD 6/5/202110:44 AM

## 2020-05-02 NOTE — Discharge Instructions (Signed)
Anemia  Anemia is a condition in which you do not have enough red blood cells or hemoglobin. Hemoglobin is a substance in red blood cells that carries oxygen. When you do not have enough red blood cells or hemoglobin (are anemic), your body cannot get enough oxygen and your organs may not work properly. As a result, you may feel very tired or have other problems. What are the causes? Common causes of anemia include:  Excessive bleeding. Anemia can be caused by excessive bleeding inside or outside the body, including bleeding from the intestine or from periods in women.  Poor nutrition.  Long-lasting (chronic) kidney, thyroid, and liver disease.  Bone marrow disorders.  Cancer and treatments for cancer.  HIV (human immunodeficiency virus) and AIDS (acquired immunodeficiency syndrome).  Treatments for HIV and AIDS.  Spleen problems.  Blood disorders.  Infections, medicines, and autoimmune disorders that destroy red blood cells. What are the signs or symptoms? Symptoms of this condition include:  Minor weakness.  Dizziness.  Headache.  Feeling heartbeats that are irregular or faster than normal (palpitations).  Shortness of breath, especially with exercise.  Paleness.  Cold sensitivity.  Indigestion.  Nausea.  Difficulty sleeping.  Difficulty concentrating. Symptoms may occur suddenly or develop slowly. If your anemia is mild, you may not have symptoms. How is this diagnosed? This condition is diagnosed based on:  Blood tests.  Your medical history.  A physical exam.  Bone marrow biopsy. Your health care provider may also check your stool (feces) for blood and may do additional testing to look for the cause of your bleeding. You may also have other tests, including:  Imaging tests, such as a CT scan or MRI.  Endoscopy.  Colonoscopy. How is this treated? Treatment for this condition depends on the cause. If you continue to lose a lot of blood, you may  need to be treated at a hospital. Treatment may include:  Taking supplements of iron, vitamin S31, or folic acid.  Taking a hormone medicine (erythropoietin) that can help to stimulate red blood cell growth.  Having a blood transfusion. This may be needed if you lose a lot of blood.  Making changes to your diet.  Having surgery to remove your spleen. Follow these instructions at home:  Take over-the-counter and prescription medicines only as told by your health care provider.  Take supplements only as told by your health care provider.  Follow any diet instructions that you were given.  Keep all follow-up visits as told by your health care provider. This is important. Contact a health care provider if:  You develop new bleeding anywhere in the body. Get help right away if:  You are very weak.  You are short of breath.  You have pain in your abdomen or chest.  You are dizzy or feel faint.  You have trouble concentrating.  You have bloody or black, tarry stools.  You vomit repeatedly or you vomit up blood. Summary  Anemia is a condition in which you do not have enough red blood cells or enough of a substance in your red blood cells that carries oxygen (hemoglobin).  Symptoms may occur suddenly or develop slowly.  If your anemia is mild, you may not have symptoms.  This condition is diagnosed with blood tests as well as a medical history and physical exam. Other tests may be needed.  Treatment for this condition depends on the cause of the anemia. This information is not intended to replace advice given to you by  your health care provider. Make sure you discuss any questions you have with your health care provider. Document Revised: 10/27/2017 Document Reviewed: 12/16/2016 Elsevier Patient Education  [place].

## 2020-05-02 NOTE — Progress Notes (Addendum)
   Subjective:  Kyle Ware is a 44 y.o. with PMH of CVA admit for symptomatic anemia on hospital day 0  Pt seen at the bedside this AM. Interested in going home today. Feels well. Denies any further bleeding events. Discussed recommendation to begin oral Fe supplementation and follow-up with PCP for continued CBC monitoring. All questions and concerns addressed.   Objective:  Vital signs in last 24 hours: Vitals:   05/01/20 2118 05/01/20 2325 05/02/20 0339 05/02/20 0949  BP: (!) 92/56 105/71 (!) 94/59 137/87  Pulse: 71 69 72 69  Resp: _0 Temp: 98.8 F (37.1 C) 98.2 F (36.8 C) 98.3 F (36.8 C)   TempSrc: Oral Oral Oral   SpO2: 98% 97% 98% 100%  Weight:      Height:       Physical Exam  Constitutional: He is oriented to person, place, and time and well-developed, well-nourished, and in no distress. No distress.  HENT:  Mouth/Throat: Oropharynx is clear and moist.  Eyes: EOM are normal. No scleral icterus.  conjuctival pallor  Cardiovascular: Regular rhythm, normal heart sounds and intact distal pulses.  Pulmonary/Chest: Effort normal and breath sounds normal. He has no wheezes. He has no rales.  Abdominal: Soft. Bowel sounds are normal. He exhibits no distension. There is no abdominal tenderness.  Musculoskeletal:        General: No edema. Normal range of motion.     Cervical back: Normal range of motion and neck supple.  Neurological: He is alert and oriented to person, place, and time.  Skin: Skin is warm. There is pallor.   Assessment/Plan:  Active Problems:   Anemia due to blood loss, chronic  Kyle Ware is a 44 y.o. with PMH of CVA admit for symptomatic anemia due to iron deficiency and chronic blood loss  Microcytic Anemia due to iron deficiency from chronic blood loss No active bleed on exam. Describes hx of chronic epixstaxis. Admit hgb 5. Received 2 units since admission. Post transfusion hgb 7.6. Ferritin 2. Ret-HE 17.5. Received 1 dose  IV Feraheme yesterady. - Discharge home today w/ oral iron supplementation  Chronic epistaxis Likely due to [place]. Significant family hx. Used to follow with ENT. S/p multiple cauterization w/ worsening sxs. - F/u with PCP - C/w Afrin spray  History of CVA:  CVA s/p tpa. Found to have PFO on work-up. Scheduled for closure on 6/15. -Resume aspirin, atorvastatin at discharge -F/u cardiology outpatient for PFO closure  Hypocalcemia 2/2 vit D deficiency Calcium level mildly low at 8.5. Chart review shows vit D low at 37 - Start vitamin D 50000 unit capsule weekly at discharge  Tobacco use disorder Smokes 1/3 pack daily -C/w nicotine patch  DVT prophx: SCDs Diet: Regular Bowel: N/A Code: Full  Dispo: Anticipated discharge in approximately 0 day(s).   Mosetta Anis, MD 05/02/2020, 9:56 AM Pager: [phone]

## 2020-05-02 NOTE — Discharge Summary (Addendum)
Name: Kyle Ware MRN: [id number] DOB: 16-Mar-1976 44 y.o. PCP: Gregor Hams, FNP  Date of Admission: 05/01/2020  9:52 AM Date of Discharge: 05/02/2020 Attending Physician: Aldine Contes  Discharge Diagnosis: 1. Iron deficiency anemia from chronic blood loss 2. Hx of posterior right MCA/PCA territory infarct 3. Vitamin D deficiency  Discharge Medications: Allergies as of 05/02/2020   No Known Allergies     Medication List    TAKE these medications   aspirin 81 MG EC tablet Take 1 tablet (81 mg total) by mouth daily.   atorvastatin 80 MG tablet Commonly known as: LIPITOR Take 1 tablet (80 mg total) by mouth daily at 6 PM.   Chantix Starting Month Pak 0.5 MG X 11 & 1 MG X 42 tablet Generic drug: varenicline Take 0.5 mg by mouth daily.   ferrous sulfate 325 (65 FE) MG EC tablet Take 1 tablet (325 mg total) by mouth 2 (two) times daily.   Vitamin D (Ergocalciferol) 1.25 MG (50000 UNIT) Caps capsule Commonly known as: DRISDOL Take 1 capsule (50,000 Units total) by mouth every 7 (seven) days.      Disposition and follow-up:   Kyle Ware was discharged from [facility] in Stable condition.  At the hospital follow up visit please address:  1.  Iron deficiency anemia - Received 2 units and discharged w/ hgb of 7.6 - Check cbc to ensure stable anemia - Ensure he continues iron supplements as prescribed  2. Hx of posterior right MCA/PCA territory infarct - Aspirin held during hospitalization while work-up of anemia ongoing - Ensure he restarts aspirin - Ensure he f/u with cardiology for PFO closure  3. Vitamin D deficiency - Noted to have hypocalcemia and recent lab results indicating vitamin D deficiency - Ensure he takes his vit D supplementation as prescribed  2.  Labs / imaging needed at time of follow-up: cbc  3.  Pending labs/ test needing follow-up: N/A  Follow-up Appointments: Follow-up Information    Gregor Hams, FNP. Call.   Specialty: Family Medicine Contact information: [place] [address] [place] Hospital Course by problem list: 1. Iron deficiency anemia from chronic blood loss: Kyle Ware is a 44 yo M w/ PMH of CVA, tobacco use presenting from cardiology office with anemia. He was found to have hemoglobin of 4.9. Per history, thought to be due to recurrent epistaxis from hx of HHT, exacerbated by dual anti-platelet therapy from recent stroke. He was found to have ferritin of 2 with low reticulocyte count consistent with iron deficiency anemia. He was given 2 units of blood as well as IV iron infusion. His post transfusion hemoglobin was 7.6 and he was found to have no active bleed and discharged with recommendation to start oral iron supplementation and follow up with his PCP.  2. Hx of posterior right MCA/PCA territory infarct: Noted to have hx of recent CVA. His aspirin was held during hospitalization but recommended to restart at discharge.  3. Vitamin D deficiency: Noted to have low calcium of 8.5. Chart review shows low vitamin D. Discharged w/ recommendation to start vitamin D supplementation.  Discharge Vitals:   BP 137/87 (BP Location: Right Arm)   Pulse 69   Temp 98.3 F (36.8 C) (Oral)   Resp 18   Ht 5\' 7"  (1.702 m)   Wt 76 kg   SpO2 100%   BMI 26.23 kg/m   Pertinent  Labs, Studies, and Procedures:  CBC Latest Ref Rng & Units 05/02/2020 05/01/2020 03/27/2020  WBC 4.0 - 10.5 K/uL 11.4(H) 8.9 7.3  Hemoglobin 13.0 - 17.0 g/dL 7.6(L) 4.9(LL) 9.4(L)  Hematocrit 39.0 - 52.0 % 24.9(L) 18.2(L) 32.0(L)  Platelets 150 - 400 K/uL 263 270 303   Iron/TIBC/Ferritin/ %Sat    Component Value Date/Time   IRON 11 (L) 05/01/2020 1451   TIBC 592 (H) 05/01/2020 1451   FERRITIN 2 (L) 05/01/2020 1451   IRONPCTSAT 2 (L) 05/01/2020 1451   BMP Latest Ref Rng & Units 05/02/2020 05/01/2020 03/27/2020  Glucose 70 - 99 mg/dL 86 270(J) 500(X)  BUN 6 - 20 mg/dL  10 15 12   Creatinine 0.61 - 1.24 mg/dL 3.81 8.29  Sodium 135 - 145 mmol/L 139 136 138  Potassium 3.5 - 5.1 mmol/L 3.6 3.7 3.7  Chloride 98 - 111 mmol/L 106 100 107  CO2 22 - 32 mmol/L 25 25 24   Calcium 8.9 - 10.3 mg/dL 9.37) ) 8.3(L)   Discharge Instructions: Discharge Instructions    Call MD for:  difficulty breathing, headache or visual disturbances   Complete by: As directed    Call MD for:  persistant dizziness or light-headedness   Complete by: As directed    Call MD for:  persistant nausea and vomiting   Complete by: As directed    Call MD for:  redness, tenderness, or signs of infection (pain, swelling, redness, odor or green/yellow discharge around incision site)   Complete by: As directed    Diet - low sodium heart healthy   Complete by: As directed    Discharge instructions   Complete by: As directed    Dear 1.6(R  You came to 6.7(E with anemia. We have determined this was caused by iron deficiency. Here are our recommendations for you at discharge:  Please continue to take ferrous sulfate 325mg  twice daily You may experience constipation with this medication so feel free to use an over-the-counter laxative as needed Also take vitamin D Roda Shutters uni capsule once every week for your vitamin D deficiency  Thank you for choosing [facility]   Increase activity slowly   Complete by: As directed      Signed: [place], MD 05/02/2020, 1:03 PM   Pager: [phone]

## 2020-05-08 ENCOUNTER — Other Ambulatory Visit (HOSPITAL_COMMUNITY)
Admission: RE | Admit: 2020-05-08 | Discharge: 2020-05-08 | Disposition: A | Discharge status: Home / Self Care | Payer: Federal, State, Local not specified - PPO | Source: Ambulatory Visit | Attending: Cardiology | Admitting: Cardiology

## 2020-05-08 DIAGNOSIS — Z20822 Contact with and (suspected) exposure to covid-19: Secondary | ICD-10-CM | POA: Insufficient documentation

## 2020-05-08 DIAGNOSIS — Z01812 Encounter for preprocedural laboratory examination: Secondary | ICD-10-CM | POA: Insufficient documentation

## 2020-05-08 LAB — SARS CORONAVIRUS 2 (TAT 6-24 HRS): SARS Coronavirus 2: NEGATIVE

## 2020-05-12 ENCOUNTER — Ambulatory Visit (HOSPITAL_COMMUNITY)
Admission: RE | Admit: 2020-05-12 | Discharge: 2020-05-12 | Disposition: A | Discharge status: Home / Self Care | Payer: Federal, State, Local not specified - PPO | Attending: Cardiology | Admitting: Cardiology

## 2020-05-12 ENCOUNTER — Ambulatory Visit (HOSPITAL_COMMUNITY)
Admission: RE | Disposition: A | Discharge status: Home / Self Care | Payer: Self-pay | Source: Home / Self Care | Attending: Cardiology

## 2020-05-12 ENCOUNTER — Other Ambulatory Visit: Payer: Self-pay

## 2020-05-12 DIAGNOSIS — F17210 Nicotine dependence, cigarettes, uncomplicated: Secondary | ICD-10-CM | POA: Insufficient documentation

## 2020-05-12 DIAGNOSIS — Z7902 Long term (current) use of antithrombotics/antiplatelets: Secondary | ICD-10-CM | POA: Diagnosis not present

## 2020-05-12 DIAGNOSIS — I63411 Cerebral infarction due to embolism of right middle cerebral artery: Secondary | ICD-10-CM | POA: Diagnosis present

## 2020-05-12 DIAGNOSIS — Z7982 Long term (current) use of aspirin: Secondary | ICD-10-CM | POA: Insufficient documentation

## 2020-05-12 DIAGNOSIS — Q211 Atrial septal defect: Secondary | ICD-10-CM | POA: Insufficient documentation

## 2020-05-12 DIAGNOSIS — E785 Hyperlipidemia, unspecified: Secondary | ICD-10-CM | POA: Insufficient documentation

## 2020-05-12 DIAGNOSIS — Z79899 Other long term (current) drug therapy: Secondary | ICD-10-CM | POA: Insufficient documentation

## 2020-05-12 DIAGNOSIS — I639 Cerebral infarction, unspecified: Secondary | ICD-10-CM | POA: Diagnosis present

## 2020-05-12 DIAGNOSIS — I749 Embolism and thrombosis of unspecified artery: Secondary | ICD-10-CM | POA: Insufficient documentation

## 2020-05-12 DIAGNOSIS — Q2112 Patent foramen ovale: Secondary | ICD-10-CM

## 2020-05-12 HISTORY — PX: RIGHT HEART CATH: CATH118263

## 2020-05-12 LAB — POCT I-STAT EG7
Acid-Base Excess: 2 mmol/L (ref 0.0–2.0)
Acid-Base Excess: 2 mmol/L (ref 0.0–2.0)
Acid-Base Excess: 2 mmol/L (ref 0.0–2.0)
Acid-Base Excess: 3 mmol/L — ABNORMAL HIGH (ref 0.0–2.0)
Bicarbonate: 26.9 mmol/L (ref 20.0–28.0)
Bicarbonate: 26.5 mmol/L (ref 20.0–28.0)
Bicarbonate: 27.2 mmol/L (ref 20.0–28.0)
Bicarbonate: 27.4 mmol/L (ref 20.0–28.0)
Calcium, Ion: 1.12 mmol/L — ABNORMAL LOW (ref 1.15–1.40)
Calcium, Ion: 1.15 mmol/L (ref 1.15–1.40)
Calcium, Ion: 1.17 mmol/L (ref 1.15–1.40)
Calcium, Ion: 1.17 mmol/L (ref 1.15–1.40)
HCT: 28 % — ABNORMAL LOW (ref 39.0–52.0)
HCT: 28 % — ABNORMAL LOW (ref 39.0–52.0)
HCT: 28 % — ABNORMAL LOW (ref 39.0–52.0)
HCT: 29 % — ABNORMAL LOW (ref 39.0–52.0)
Hemoglobin: 9.5 g/dL — ABNORMAL LOW (ref 13.0–17.0)
Hemoglobin: 9.5 g/dL — ABNORMAL LOW (ref 13.0–17.0)
Hemoglobin: 9.5 g/dL — ABNORMAL LOW (ref 13.0–17.0)
Hemoglobin: 9.9 g/dL — ABNORMAL LOW (ref 13.0–17.0)
O2 Saturation: 76 %
O2 Saturation: 76 %
O2 Saturation: 76 %
O2 Saturation: 99 %
Potassium: 3.5 mmol/L (ref 3.5–5.1)
Potassium: 3.5 mmol/L (ref 3.5–5.1)
Potassium: 3.6 mmol/L (ref 3.5–5.1)
Potassium: 3.6 mmol/L (ref 3.5–5.1)
Sodium: 143 mmol/L (ref 135–145)
Sodium: 143 mmol/L (ref 135–145)
Sodium: 143 mmol/L (ref 135–145)
Sodium: 144 mmol/L (ref 135–145)
TCO2: 28 mmol/L (ref 22–32)
TCO2: 28 mmol/L (ref 22–32)
TCO2: 28 mmol/L (ref 22–32)
TCO2: 29 mmol/L (ref 22–32)
pCO2, Ven: 42.9 mmHg — ABNORMAL LOW (ref 44.0–60.0)
pCO2, Ven: 40.1 mmHg — ABNORMAL LOW (ref 44.0–60.0)
pCO2, Ven: 42 mmHg — ABNORMAL LOW (ref 44.0–60.0)
pCO2, Ven: 44.2 mmHg (ref 44.0–60.0)
pH, Ven: 7.405 (ref 7.250–7.430)
pH, Ven: 7.397 (ref 7.250–7.430)
pH, Ven: 7.423 (ref 7.250–7.430)
pH, Ven: 7.429 (ref 7.250–7.430)
pO2, Ven: 41 mmHg (ref 32.0–45.0)
pO2, Ven: 139 mmHg — ABNORMAL HIGH (ref 32.0–45.0)
pO2, Ven: 40 mmHg (ref 32.0–45.0)
pO2, Ven: 41 mmHg (ref 32.0–45.0)

## 2020-05-12 LAB — CBC
HCT: 32.7 % — ABNORMAL LOW (ref 39.0–52.0)
Hemoglobin: 9.6 g/dL — ABNORMAL LOW (ref 13.0–17.0)
MCH: 26.2 pg (ref 26.0–34.0)
MCHC: 29.4 g/dL — ABNORMAL LOW (ref 30.0–36.0)
MCV: 89.3 fL (ref 80.0–100.0)
Platelets: 236 10*3/uL (ref 150–400)
RBC: 3.66 MIL/uL — ABNORMAL LOW (ref 4.22–5.81)
RDW: 26.5 % — ABNORMAL HIGH (ref 11.5–15.5)
WBC: 9.3 10*3/uL (ref 4.0–10.5)
nRBC: 0 % (ref 0.0–0.2)

## 2020-05-12 LAB — POCT ACTIVATED CLOTTING TIME
Activated Clotting Time: 230 seconds
Activated Clotting Time: 197 seconds
Activated Clotting Time: 175 seconds

## 2020-05-12 SURGERY — RIGHT HEART CATH

## 2020-05-12 MED ORDER — FENTANYL CITRATE (PF) 100 MCG/2ML IJ SOLN
INTRAMUSCULAR | Status: AC
  Filled 2020-05-12: qty 2

## 2020-05-12 MED ORDER — SODIUM CHLORIDE 0.9% FLUSH: 3.0000 mL | Freq: Two times a day (BID) | INTRAVENOUS | Status: DC

## 2020-05-12 MED ORDER — DEXTROSE 5 % IV SOLN
3.0000 g | INTRAVENOUS | Status: DC
  Filled 2020-05-12 (×2): qty 3000

## 2020-05-12 MED ORDER — HEPARIN SODIUM (PORCINE) 1000 UNIT/ML IJ SOLN
INTRAMUSCULAR | Status: AC
  Filled 2020-05-12: qty 1

## 2020-05-12 MED ORDER — ONDANSETRON HCL 4 MG/2ML IJ SOLN
INTRAMUSCULAR | Status: AC
  Filled 2020-05-12: qty 2

## 2020-05-12 MED ORDER — SODIUM CHLORIDE 0.9 % IV SOLN: 250.0000 mL | INTRAVENOUS | Status: DC | PRN

## 2020-05-12 MED ORDER — ONDANSETRON HCL 4 MG/2ML IJ SOLN
4.0000 mg | Freq: Four times a day (QID) | INTRAMUSCULAR | Status: DC | PRN
  Administered 2020-05-12: 4 mg via INTRAVENOUS

## 2020-05-12 MED ORDER — CEFAZOLIN SODIUM-DEXTROSE 2-3 GM-%(50ML) IV SOLR
INTRAVENOUS | Status: AC | PRN
  Administered 2020-05-12: 2 g via INTRAVENOUS

## 2020-05-12 MED ORDER — ASPIRIN 81 MG PO CHEW: 81.0000 mg | CHEWABLE_TABLET | ORAL | Status: AC

## 2020-05-12 MED ORDER — ASPIRIN 81 MG PO CHEW
CHEWABLE_TABLET | ORAL | Status: AC
  Administered 2020-05-12: 81 mg via ORAL
  Filled 2020-05-12: qty 1

## 2020-05-12 MED ORDER — HEPARIN (PORCINE) IN NACL 1000-0.9 UT/500ML-% IV SOLN
INTRAVENOUS | Status: DC | PRN
  Administered 2020-05-12: 500 mL

## 2020-05-12 MED ORDER — SODIUM CHLORIDE 0.9 % WEIGHT BASED INFUSION
3.0000 mL/kg/h | INTRAVENOUS | Status: DC
  Administered 2020-05-12: 3 mL/kg/h via INTRAVENOUS

## 2020-05-12 MED ORDER — SODIUM CHLORIDE 0.9% FLUSH: 3.0000 mL | INTRAVENOUS | Status: DC | PRN

## 2020-05-12 MED ORDER — FENTANYL CITRATE (PF) 100 MCG/2ML IJ SOLN
INTRAMUSCULAR | Status: DC | PRN
  Administered 2020-05-12: 25 ug via INTRAVENOUS

## 2020-05-12 MED ORDER — SODIUM CHLORIDE 0.9 % IV BOLUS: 500.0000 mL | Freq: Once | INTRAVENOUS | Status: DC

## 2020-05-12 MED ORDER — MIDAZOLAM HCL 2 MG/2ML IJ SOLN
INTRAMUSCULAR | Status: AC
  Filled 2020-05-12: qty 2

## 2020-05-12 MED ORDER — LIDOCAINE HCL (PF) 1 % IJ SOLN
INTRAMUSCULAR | Status: DC | PRN
  Administered 2020-05-12: 30 mL

## 2020-05-12 MED ORDER — CEFAZOLIN SODIUM-DEXTROSE 2-4 GM/100ML-% IV SOLN
INTRAVENOUS | Status: AC
  Filled 2020-05-12: qty 100

## 2020-05-12 MED ORDER — MIDAZOLAM HCL 2 MG/2ML IJ SOLN
INTRAMUSCULAR | Status: DC | PRN
  Administered 2020-05-12: 2 mg via INTRAVENOUS

## 2020-05-12 MED ORDER — SODIUM CHLORIDE 0.9 % WEIGHT BASED INFUSION: 1.0000 mL/kg/h | INTRAVENOUS | Status: DC

## 2020-05-12 MED ORDER — HEPARIN SODIUM (PORCINE) 1000 UNIT/ML IJ SOLN
INTRAMUSCULAR | Status: DC | PRN
  Administered 2020-05-12: 8000 [IU] via INTRAVENOUS

## 2020-05-12 MED ORDER — ACETAMINOPHEN 325 MG PO TABS: 650.0000 mg | ORAL_TABLET | ORAL | Status: DC | PRN

## 2020-05-12 MED ORDER — CLOPIDOGREL BISULFATE 75 MG PO TABS: 300.0000 mg | ORAL_TABLET | ORAL | Status: AC

## 2020-05-12 MED ORDER — CLOPIDOGREL BISULFATE 75 MG PO TABS
ORAL_TABLET | ORAL | Status: AC
  Administered 2020-05-12: 300 mg via ORAL
  Filled 2020-05-12: qty 4

## 2020-05-12 MED ORDER — HEPARIN (PORCINE) IN NACL 2000-0.9 UNIT/L-% IV SOLN
INTRAVENOUS | Status: AC
  Filled 2020-05-12: qty 1000

## 2020-05-12 SURGICAL SUPPLY — 17 items
CATH ACUNAV 8FR 90CM (CATHETERS) ×1 IMPLANT
CATH EXPO 5F MPA-1 (CATHETERS) ×1 IMPLANT
CATH SWAN GANZ 7F STRAIGHT (CATHETERS) ×1 IMPLANT
CLOSURE MYNX CONTROL 5F (Vascular Products) ×1 IMPLANT
GUIDEWIRE AMPLATZER 1.5JX260 (WIRE) ×1 IMPLANT
GUIDEWIRE ANGLED .035X150CM (WIRE) ×1 IMPLANT
KIT MICROPUNCTURE NIT STIFF (SHEATH) ×1 IMPLANT
PACK CARDIAC CATHETERIZATION (CUSTOM PROCEDURE TRAY) ×2 IMPLANT
PROTECTION STATION PRESSURIZED (MISCELLANEOUS) ×2
SHEATH PINNACLE 5F 10CM (SHEATH) ×1 IMPLANT
SHEATH PINNACLE 8F 10CM (SHEATH) ×1 IMPLANT
SHEATH PINNACLE 9F 10CM (SHEATH) ×1 IMPLANT
SHEATH PROBE COVER 6X72 (BAG) ×1 IMPLANT
STATION PROTECTION PRESSURIZED (MISCELLANEOUS) IMPLANT
TRANSDUCER W/STOPCOCK (MISCELLANEOUS) ×1 IMPLANT
TUBING ART PRESS 72  MALE/FEM (TUBING) ×2
TUBING ART PRESS 72 MALE/FEM (TUBING) IMPLANT

## 2020-05-12 NOTE — Progress Notes (Signed)
Site area: rt groin fv sheaths x2 Site Prior to Removal:  Level 1 hematoma Pressure Applied For: Manual:   yes Patient Status During Pull:  BP dropped Post Pull Site:  Level 1 Post Pull Instructions Given:  yes Post Pull Pulses Present: rt dp palpable Dressing Applied:  Gauze and tegaderm Bedrest begins @  Comments:  BP dropped soon after sheath pull; HR stable nauseated, diaphoretic and pale. HOB down, Zofran, 500cc NS bolus per Dr. Verl Dicker order

## 2020-05-12 NOTE — Interval H&P Note (Signed)
History and Physical Interval Note:  05/12/2020 1:36 PM  Kyle Ware  has presented today for surgery, with the diagnosis of PFO.  The various methods of treatment have been discussed with the patient and family. After consideration of risks, benefits and other options for treatment, the patient has consented to  Procedure(s): PATENT FORAMEN OVALE (PFO) CLOSURE (N/A) as a surgical intervention.  The patient's history has been reviewed, patient examined, no change in status, stable for surgery.  I have reviewed the patient's chart and labs.  Questions were answered to the patient's satisfaction.     Yates Decamp

## 2020-05-12 NOTE — Progress Notes (Signed)
Site area: rt groin fv sheaths x2 Site Prior to Removal:  Level 1 hematoma Pressure Applied For: 90 minutes Manual:   yes Patient Status During Pull:  BP dropped Post Pull Site:  Level 1 Post Pull Instructions Given:  yes Post Pull Pulses Present: rt dp palpable Dressing Applied:  Gauze and tegaderm Bedrest begins @ 17:15 Comments:  BP dropped soon after sheath pull; HR stable nauseated, diaphoretic and pale. HOB down, Zofran, 500cc NS bolus per Dr. Verl Dicker order.

## 2020-05-12 NOTE — Discharge Instructions (Signed)
Angiogram, Care After This sheet gives you information about how to care for yourself after your procedure. Your health care provider may also give you more specific instructions. If you have problems or questions, contact your health care provider. What can I expect after the procedure? After the procedure, it is common to have bruising and tenderness at the catheter insertion area. Follow these instructions at home: Insertion site care  Follow instructions from your health care provider about how to take care of your insertion site. Make sure you: ? Wash your hands with soap and water before you change your bandage (dressing). If soap and water are not available, use hand sanitizer. ? Change your dressing as told by your health care provider. ? Leave stitches (sutures), skin glue, or adhesive strips in place. These skin closures may need to stay in place for 2 weeks or longer. If adhesive strip edges start to loosen and curl up, you may trim the loose edges. Do not remove adhesive strips completely unless your health care provider tells you to do that.  Do not take baths, swim, or use a hot tub until your health care provider approves.  You may shower 24-48 hours after the procedure or as told by your health care provider. ? Gently wash the site with plain soap and water. ? Pat the area dry with a clean towel. ? Do not rub the site. This may cause bleeding.  Do not apply powder or lotion to the site. Keep the site clean and dry.  Check your insertion site every day for signs of infection. Check for: ? Redness, swelling, or pain. ? Fluid or blood. ? Warmth. ? Pus or a bad smell. Activity  Rest as told by your health care provider, usually for 1-2 days.  Do not lift anything that is heavier than 10 lbs. (4.5 kg) or as told by your health care provider.  Do not drive for 24 hours if you were given a medicine to help you relax (sedative).  Do not drive or use heavy machinery while  taking prescription pain medicine. General instructions   Return to your normal activities as told by your health care provider, usually in about a week. Ask your health care provider what activities are safe for you.  If the catheter site starts bleeding, lie flat and put pressure on the site. If the bleeding does not stop, get help right away. This is a medical emergency.  Drink enough fluid to keep your urine clear or pale yellow. This helps flush the contrast dye from your body.  Take over-the-counter and prescription medicines only as told by your health care provider.  Keep all follow-up visits as told by your health care provider. This is important. Contact a health care provider if:  You have a fever or chills.  You have redness, swelling, or pain around your insertion site.  You have fluid or blood coming from your insertion site.  The insertion site feels warm to the touch.  You have pus or a bad smell coming from your insertion site.  You have bruising around the insertion site.  You notice blood collecting in the tissue around the catheter site (hematoma). The hematoma may be painful to the touch. Get help right away if:  You have severe pain at the catheter insertion area.  The catheter insertion area swells very fast.  The catheter insertion area is bleeding, and the bleeding does not stop when you hold steady pressure on the area.    The area near or just beyond the catheter insertion site becomes pale, cool, tingly, or numb. These symptoms may represent a serious problem that is an emergency. Do not wait to see if the symptoms will go away. Get medical help right away. Call your local emergency services (911 in the [place] Do not drive yourself to the hospital. Summary  After the procedure, it is common to have bruising and tenderness at the catheter insertion area.  After the procedure, it is important to rest and drink plenty of fluids.  Do not take baths,  swim, or use a hot tub until your health care provider says it is okay to do so. You may shower 24-48 hours after the procedure or as told by your health care provider.  If the catheter site starts bleeding, lie flat and put pressure on the site. If the bleeding does not stop, get help right away. This is a medical emergency. This information is not intended to replace advice given to you by your health care provider. Make sure you discuss any questions you have with your health care provider. Document Revised: 10/27/2017 Document Reviewed: 10/19/2016 Elsevier Patient Education  2020 Elsevier Inc.  

## 2020-05-13 ENCOUNTER — Encounter (HOSPITAL_COMMUNITY): Payer: Self-pay | Admitting: Cardiology

## 2020-05-13 MED FILL — Cefazolin Sodium-Dextrose IV Solution 2 GM/100ML-4%: INTRAVENOUS | Qty: 100 | Status: AC

## 2020-05-14 ENCOUNTER — Encounter (HOSPITAL_COMMUNITY): Payer: Self-pay | Admitting: Cardiology

## 2020-05-21 ENCOUNTER — Other Ambulatory Visit: Payer: Self-pay

## 2020-05-21 ENCOUNTER — Ambulatory Visit: Payer: Federal, State, Local not specified - PPO | Admitting: Cardiology

## 2020-05-21 ENCOUNTER — Encounter: Payer: Self-pay | Admitting: Cardiology

## 2020-05-21 VITALS — BP 116/85 | HR 73 | Resp 16 | Ht 67.0 in | Wt 169.0 lb

## 2020-05-21 DIAGNOSIS — I280 Arteriovenous fistula of pulmonary vessels: Secondary | ICD-10-CM

## 2020-05-21 DIAGNOSIS — F17200 Nicotine dependence, unspecified, uncomplicated: Secondary | ICD-10-CM

## 2020-05-21 DIAGNOSIS — E782 Mixed hyperlipidemia: Secondary | ICD-10-CM

## 2020-05-21 DIAGNOSIS — I63411 Cerebral infarction due to embolism of right middle cerebral artery: Secondary | ICD-10-CM

## 2020-05-21 NOTE — Progress Notes (Signed)
Primary Physician/Referring:Maudie Flakes, FNP [address] [place],  [place] [address]   Patient ID: Kyle Ware, male    DOB: 1976/03/14, 44 y.o.   MRN: [id number]  Chief Complaint  Patient presents with  . Follow-up    4 week  . PFO repair   HPI:    ADRICK KESTLER  is a 44 y.o. Caucasian male referred by Dr. Pearlean Brownie, patient with no significant prior cardiovascular history, he had not seen a physician in a while, presented to the emergency room via EMS on 04/28 for acute left sided weakness, treated with IV tPA with excellent neurologic recovery. YIR:[id number] acute ischemia within the right middle and posterior cerebral artery territories.  I had seen him for consideration of PFO closure and also evaluation for any atrial arrhythmias specifically atrial fibrillation.   Patient's cardiac risk factors now include history of tobacco use disorder, mixed hyperlipidemia.  States that except for tingling or numbness in his left arm, he has pretty much recovered well from stroke.   Patient underwent right heart catheterization and intracardiac echocardiogram on 05/12/2020, did not have a PFO but did have left lower lobe pulmonary AV fistula.  He now presents to the office quite upset that he had to go through unnecessary testing and surgery and want my staff know that he is not happy and that "Dr. Jacinto Halim will not have a good day after this visit".Wife is present at the bedside.   Past Medical History:  Diagnosis Date  . Anemia 05/01/2020   BLOOD TRANSFUSION  . Asthma   . History of kidney stones   . Hyperlipidemia   . PFO (patent foramen ovale)   . Stroke [facility]) 2021   Past Surgical History:  Procedure Laterality Date  . BUBBLE STUDY  03/27/2020   Procedure: BUBBLE STUDY;  Surgeon: Little Ishikawa, MD;  Location: [facility] ENDOSCOPY;  Service: Cardiovascular;;  . RIGHT HEART CATH N/A 05/12/2020   Procedure: RIGHT HEART CATH;  Surgeon: Yates Decamp, MD;  Location: [facility]  INVASIVE CV LAB;  Service: Cardiovascular;  Laterality: N/A;  . TEE WITHOUT CARDIOVERSION N/A 03/27/2020   Procedure: TRANSESOPHAGEAL ECHOCARDIOGRAM (TEE);  Surgeon: Little Ishikawa, MD;  Location: [facility] ENDOSCOPY;  Service: Cardiovascular;  Laterality: N/A;   Family History  Problem Relation Age of Onset  . Hypertension Sister   . Hypertension Brother     Social History   Tobacco Use  . Smoking status: Current Every Day Smoker    Packs/day: 0.50    Types: Cigarettes  . Smokeless tobacco: Never Used  . Tobacco comment: TAKES CHANTIX  Substance Use Topics  . Alcohol use: Not Currently    Alcohol/week: 6.0 standard drinks    Types: 6 Shots of liquor per week    Comment: occasional (monthly)   Marital Status: Married  ROS  Review of Systems  Unable to perform ROS: other (Patient did not want to discuss any of the review of systems.)   Objective  Blood pressure 116/85, pulse 73, resp. rate 16, height 5\' 7"  (1.702 m), weight 169 lb (76.7 kg), SpO2 100 %.  Vitals with BMI 05/21/2020 05/12/2020 05/12/2020  Height 5\' 7"  - -  Weight 169 lbs - -  BMI 26.46 - -  Systolic 116 97 104  Diastolic 85 61 78  Pulse 73 85 88    Physical exam was not performed by me today as patient did not allow me. Laboratory examination:   Recent Labs    03/27/20 0239  03/27/20 0239 05/01/20 1017 05/01/20 1017 05/02/20 0549 05/12/20 1415 05/12/20 1417 05/12/20 1419 05/12/20 1429  NA 138   < > 136   < > 139   < > 143 144 143  K 3.7   < > 3.7   < > 3.6   < > 3.5 3.6 3.6  CL 107  --  100  --  106  --   --   --   --   CO2 24  --  25  --  25  --   --   --   --   GLUCOSE 102*  --  136*  --  86  --   --   --   --   BUN 12  --  15  --  10  --   --   --   --   CREATININE 1.02  --  1.10  --  1.06  --   --   --   --   CALCIUM 8.3*  --  8.7*  --  8.5*  --   --   --   --   GFRNONAA >60  --  >60  --  >60  --   --   --   --   GFRAA >60  --  >60  --  >60  --   --   --   --    < > = values in this  interval not displayed.   estimated creatinine clearance is 82.3 mL/min (by C-G formula based on SCr of 1.06 mg/dL).  CMP Latest Ref Rng & Units 05/12/2020 05/12/2020 05/12/2020  Glucose 70 - 99 mg/dL - - -  BUN 6 - 20 mg/dL - - -  Creatinine 2.35 - 1.24 mg/dL - - -  Sodium 361 - 443 mmol/L 143 144 143  Potassium 3.5 - 5.1 mmol/L 3.6 3.6 3.5  Chloride 98 - 111 mmol/L - - -  CO2 22 - 32 mmol/L - - -  Calcium 8.9 - 10.3 mg/dL - - -  Total Protein 6.5 - 8.1 g/dL - - -  Total Bilirubin 0.3 - 1.2 mg/dL - - -  Alkaline Phos 38 - 126 U/L - - -  AST 15 - 41 U/L - - -  ALT 0 - 44 U/L - - -   CBC Latest Ref Rng & Units 05/12/2020 05/12/2020 05/12/2020  WBC 4.0 - 10.5 K/uL - - -  Hemoglobin 13.0 - 17.0 g/dL 1.5(Q) 0.0(Q) 6.7(Y)  Hematocrit 39 - 52 % 28.0(L) 29.0(L) 28.0(L)  Platelets 150 - 400 K/uL - - -   Lipid Panel Recent Labs    03/26/20 0317  CHOL 273*  TRIG 410*  LDLCALC UNABLE TO CALCULATE IF TRIGLYCERIDE OVER 400 mg/dL  VLDL UNABLE TO CALCULATE IF TRIGLYCERIDE OVER 400 mg/dL  HDL 37*  CHOLHDL 7.4  LDLDIRECT 122.2*    HEMOGLOBIN A1C Lab Results  Component Value Date   HGBA1C 5.7 (H) 03/26/2020   MPG 116.89 03/26/2020   TSH No results for input(s): TSH in the last 8760 hours.    Medications and allergies  No Known Allergies   No orders of the defined types were placed in this encounter.  Radiology:   CT Head 03/25/2020: 1. Negative head CT.  No acute intracranial abnormality identified. 2. ASPECTS is 10.   CTA Head & Neck 03/25/2020: 1. Negative CTA for large vessel occlusion. 2. Atheromatous plaque about the right carotid bifurcation/proximal right ICA with associated stenosis of up  to 50% by NASCET criteria. 3. Otherwise wide patency of the major arterial vasculature of the head and neck. No other hemodynamically significant or correctable stenosis. No other significant atheromatous disease for age. 4. Mild tree-in-bud reticular densities within the peripheral  right upper lobe, nonspecific, but suspicious for mild pneumonitis/small airways disease.  MRI Brain without contrast 03/25/2020: 1. Multifocal acute ischemia within the right middle and posterior cerebral artery territories. Given the presence of a right P-comm, this pattern is compatible with emboli from the right carotid system. 2. No hemorrhage or mass effect.  Cardiac Studies:   Echocardiogram 03/25/2020: 1. Left ventricular ejection fraction, by estimation, is 65 to 70%. The left ventricle has normal function. The left ventricle has no regional wall motion abnormalities. Left ventricular diastolic parameters are consistent with Grade I diastolic  dysfunction (impaired relaxation).  2. Right ventricular systolic function is normal. The right ventricular size is normal.  3. The mitral valve is normal in structure. No evidence of mitral valve regurgitation. No evidence of mitral stenosis.  4. The aortic valve is normal in structure. Aortic valve regurgitation is not visualized. No aortic stenosis is present.  5. The inferior vena cava is normal in size with greater than 50% respiratory variability, suggesting right atrial pressure of 3 mmHg.   Lower Venous DVT Study 03/26/2020: RIGHT:  - There is no evidence of deep vein thrombosis in the lower extremity.  - No cystic structure found in the popliteal fossa.  LEFT:  - There is no evidence of deep vein thrombosis in the lower extremity.  - No cystic structure found in the popliteal fossa.    Echocardiogram TEE 03/27/2020: 1. Left ventricular ejection fraction, by estimation, is 60 to 65%. The left ventricle has normal function. The left ventricle has no regional wall motion abnormalities.  2. Right ventricular systolic function is normal. The right ventricular size is normal.  3. No left atrial/left atrial appendage thrombus was detected.  4. The mitral valve is grossly normal. Trivial mitral valve regurgitation.  5. The aortic valve  is tricuspid. Aortic valve regurgitation is not visualized. No aortic stenosis is present.  6. Technically difficult study. Patient had significant gag reflex which limited exam. Sedation was increased to suppress gag reflex, but patient began desaturating and probe was withdrawn. Transgastric views were not obtained.  7. Agitated saline contrast bubble study was positive with shunting observed within 3-6 cardiac cycles suggestive of interatrial shunt.   Intracardiac echocardiogram and right hear 05/12/20 :  Normal cardiac structures, no intracardiac shunting.  Double contrast study reveals shunting from the left pulmonary AV shunt.  No shunting from the right pulmonary AV shunt. Right heart catheterization revealing normal right heart pressures.  There was no stepup or stepdown with regard to oxygen saturation.  QP/QS was 1.00, normal. RA pressure 4/4, mean 1; RV 17/1, EDP 3 mmHg.  PA 16/7, mean 11 mmHg.  PW 9/8, mean 6 mmHg. PA saturation 76%.  RA saturation 76%.  Aortic saturation 99%. CO 8.26, CI 4.43.  EKG  03/31/2020: Normal sinus rhythm with rate of 72 bpm, normal axis. IRBBB.  No evidence of ischemia, normal EKG. No significant change from 03/25/2020:   Assessment     ICD-10-CM   1. Cerebrovascular accident (CVA) due to embolism of right middle cerebral artery ([place])  I63.411   2. Pulmonary AV (arteriovenous) fistula (HCC)  I28.0   3. Mixed hyperlipidemia  E78.2   4. Tobacco use disorder  F17.200  Outpatient Encounter Medications as of 05/21/2020  Medication Sig  . Ascorbic Acid (VITAMIN C) 1000 MG tablet Take 1,000 mg by mouth daily.  Marland Kitchen aspirin EC 81 MG EC tablet Take 1 tablet (81 mg total) by mouth daily.  Marland Kitchen atorvastatin (LIPITOR) 80 MG tablet Take 1 tablet (80 mg total) by mouth daily at 6 PM.  . CHANTIX STARTING MONTH PAK 0.5 MG X 11 & 1 MG X 42 tablet Take 0.5 mg by mouth daily.  . ferrous sulfate 325 (65 FE) MG EC tablet Take 1 tablet (325 mg total) by mouth 2 (two)  times daily.  Marland Kitchen levothyroxine (SYNTHROID) 50 MCG tablet Take 50 mcg by mouth daily before breakfast.  . Multiple Vitamins-Minerals (MULTIVITAMIN WITH MINERALS) tablet Take 1 tablet by mouth daily.  . Vitamin D, Ergocalciferol, (DRISDOL) 1.25 MG (50000 UNIT) CAPS capsule Take 1 capsule (50,000 Units total) by mouth every 7 (seven) days.   No facility-administered encounter medications on file as of 05/21/2020.    Recommendations:   KAIRAV RUSSOMANNO  is a 44 y.o. Caucasian male with no significant prior cardiovascular history, he had not seen a physician in a while, presented to the emergency room via EMS on 04/28 for acute left sided weakness, treated with IV tPA with excellent neurologic recovery. [id number] acute ischemia within the right middle and posterior cerebral artery territories.  He was seen by me for consideration of PFO closure and also evaluation for any atrial arrhythmias specifically atrial fibrillation.  TEE had suggested possibility of a PFO and also transcranial bubble study was suggestive of large intracardiac shunting and potential etiology for paradoxical stroke.  Patient underwent right heart catheterization and intracardiac echocardiogram on 05/12/2020, did not have a PFO but did have left lower lobe pulmonary AV fistula.  He now presents to the office quite upset that he had to go through unnecessary testing and surgery and want my staff know that he is not happy and that "Dr. Jacinto Halim will not have a good day after this visit".  I discussed with the patient and his wife that I had done due diligence, TEE images were of poor quality but the operator had difficulty during the procedure as well but there are 2 tests suggesting intracardiac shunting.  I also suggested to them that the procedure itself is not totally wasteful as we were able to detect a fairly good-sized pulmonary AV fistula by contrast study.  He needs to be considered for coiling of the fistula if it is large  enough.  Patient states that a CT angiogram has been ordered by Azzie Roup and patient wishes to follow-up with [facility] pulmonary medicine and he will decide which steps to take after that.  Although patient initially was upset with the staff, he did not behave irrationally, he did not allow me to examine him or touch him to examine his groin to make sure that he is healed well.  I spent 15 minutes explaining the procedure site done and the due diligence that had been done prior to setting up any procedures including direct phone conversation with his neurologist Dr. Pearlean Brownie.  I reviewed all the data with the patient and his wife again including TCD bubble study suggestive of PFO or intracardiac shunting and paradoxical stroke.  We discussed regarding surgical closure versus percutaneous closure and all the risks associated with percutaneous closure including less than 1% risk of temporary arrhythmias but rarely permanent arrhythmias especially atrial arrhythmias, cardiac perforation, need for emergent surgical intervention, stroke.  All questions were discussed in detail with the patient and his wife and all questions answered to their satisfaction.  Also offered surgical consultation.  Patient wants to proceed with percutaneous closure.  Smoking cessation was discussed again.  With regard to labs, he just had his complete physical examination today by Azzie RoupShane Anderson, FNP and labs have been ordered.  I will follow up on this prior to PFO closure for stability for the procedure.  I will see him back in 4 weeks following PFO repair.  Yates DecampJay Maysen Bonsignore, MD, [facility] CampusFACC 05/21/2020, 1:41 PM [facility] [place] Pager: [phone] Office: [phone]  CC: Delia HeadyPramod Sethi, MD

## 2020-06-23 ENCOUNTER — Other Ambulatory Visit: Payer: Self-pay

## 2020-06-23 NOTE — Patient Outreach (Signed)
[facility]) Care Management  06/23/2020  Kyle Ware [birth year] [id number]   Telephone outreach to patient to obtain mRS was successfully completed. MRS=1  Patient shared with CMA while completing mRs he did not have the best experience on none of his encounters in the ED. Patient shared besides extended waiting times, he felt he was not diagnosed properly while admitted.   CMA shared with patient his call would be documented for future reference and that he was welcomed to share his concerns with the Patient Experience Department through the [facility] website. CMA concluded the call by thanking the patient for sharing his concerns and apologizing for this unfortunate incident.  Baruch Gouty [facility] Management Assistant [phone]

## 2020-10-20 IMAGING — MR MR HEAD W/O CM
6 of 10 series · 24 of 48 positions shown · non-contrast
Comparison: Head CT 03/25/2020

CLINICAL DATA: Status post tPA. Improved stroke symptoms.

EXAM:
MRI HEAD WITHOUT CONTRAST
TECHNIQUE: Multiplanar, multiecho pulse sequences of the brain and surrounding
structures were obtained without intravenous contrast.

[Series 3: DWI · axial · 3.0mm · 0.94mm/px · z∈[-32,+121]mm · 9 of 104 slices shown (1 of 2)]
[im 1/104]
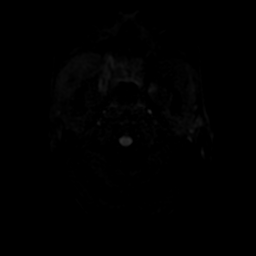
[im 13/104]
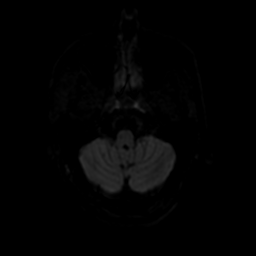
[im 26/104]
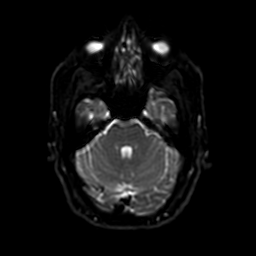
[im 39/104]
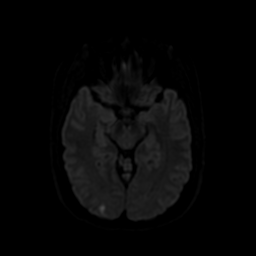
[im 52/104]
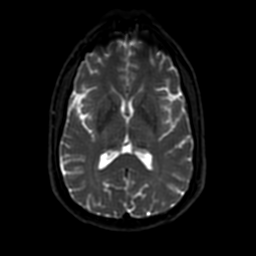
[im 65/104]
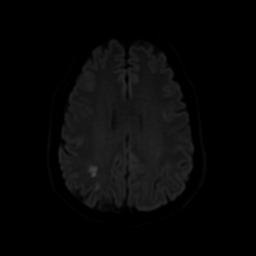
[im 78/104]
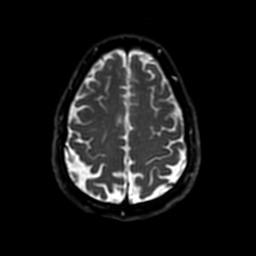
[im 91/104]
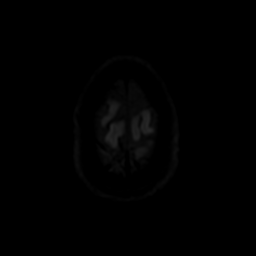
[im 104/104]
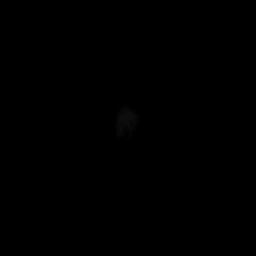

[Series 4: DWI · coronal · 4.0mm · 0.94mm/px · 6 of 76 slices shown (2 of 2)]
[im 1/76]
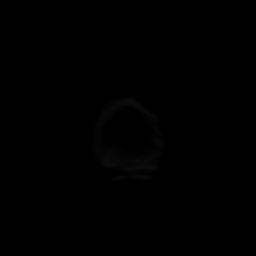
[im 16/76]
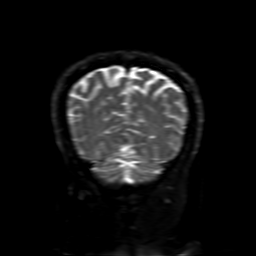
[im 31/76]
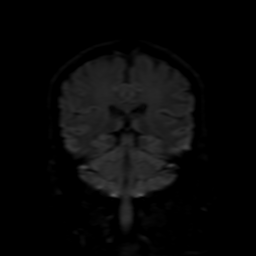
[im 46/76]
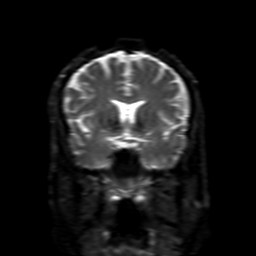
[im 61/76]
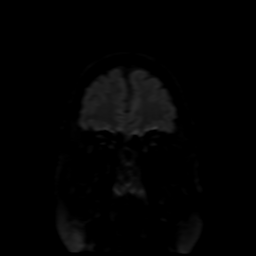
[im 76/76]
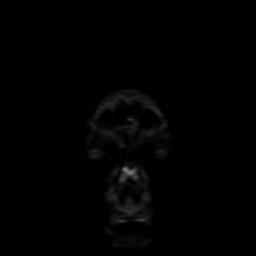

[Series 5: FLAIR · sagittal · 5.0mm · 0.23mm/px · 2 of 26 slices shown (1 of 2)]
[im 1/26]
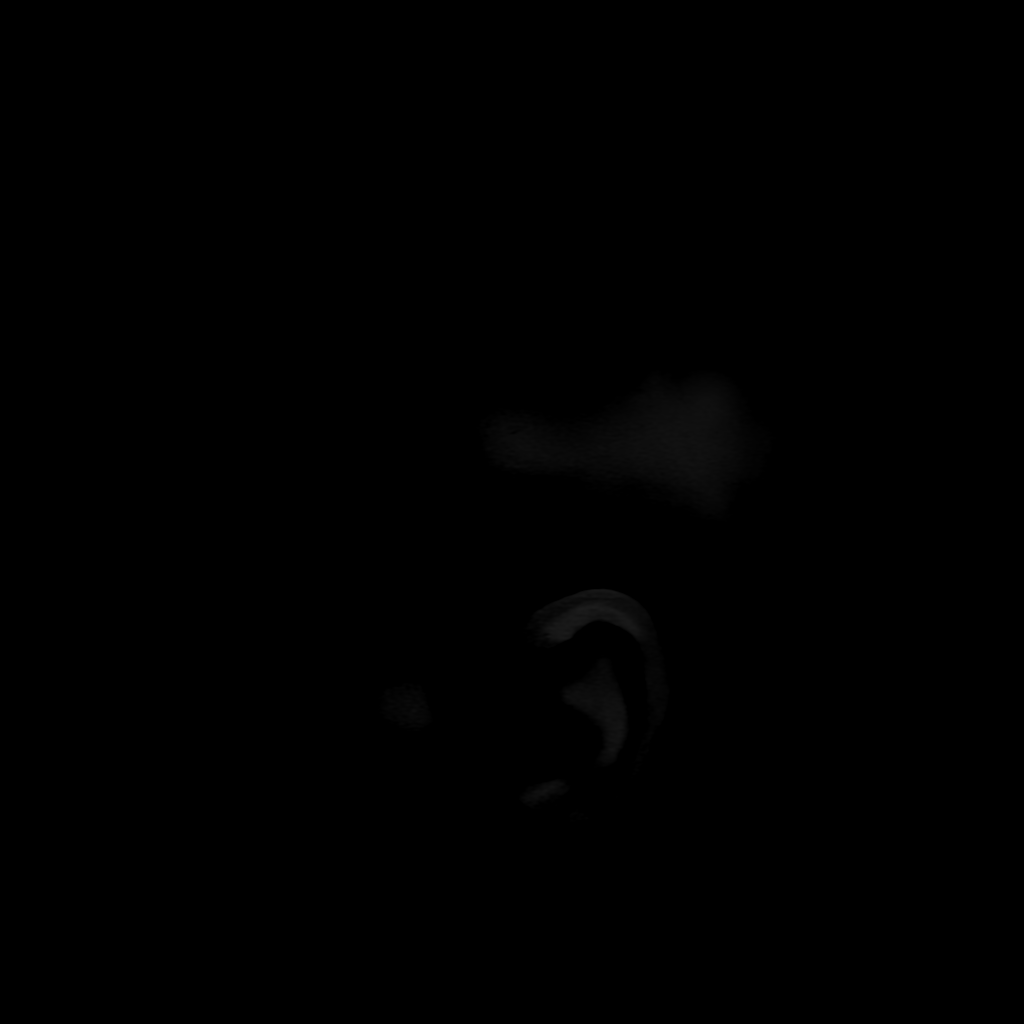
[im 26/26]
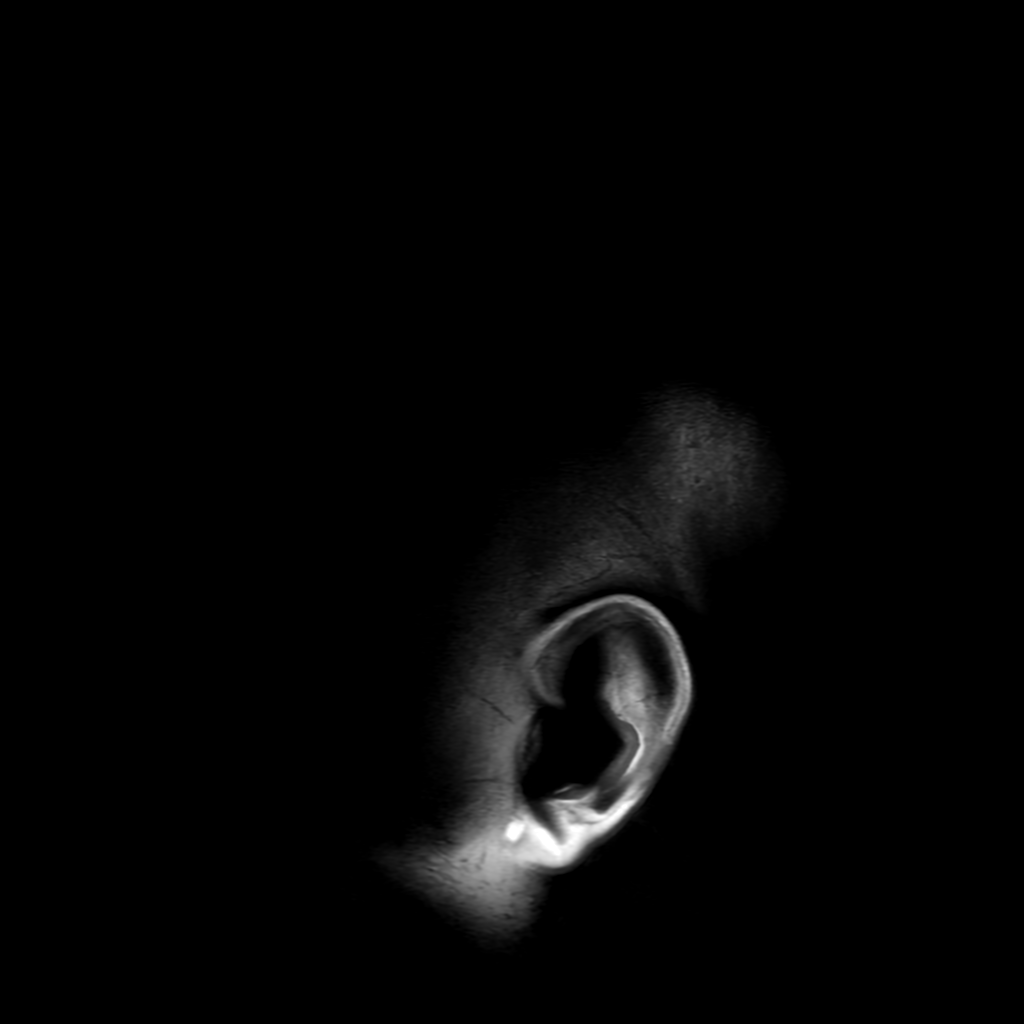

[Series 6: T2 · axial · 5.0mm · 0.23mm/px · 1 of 27 slices shown]
[im 1/27]
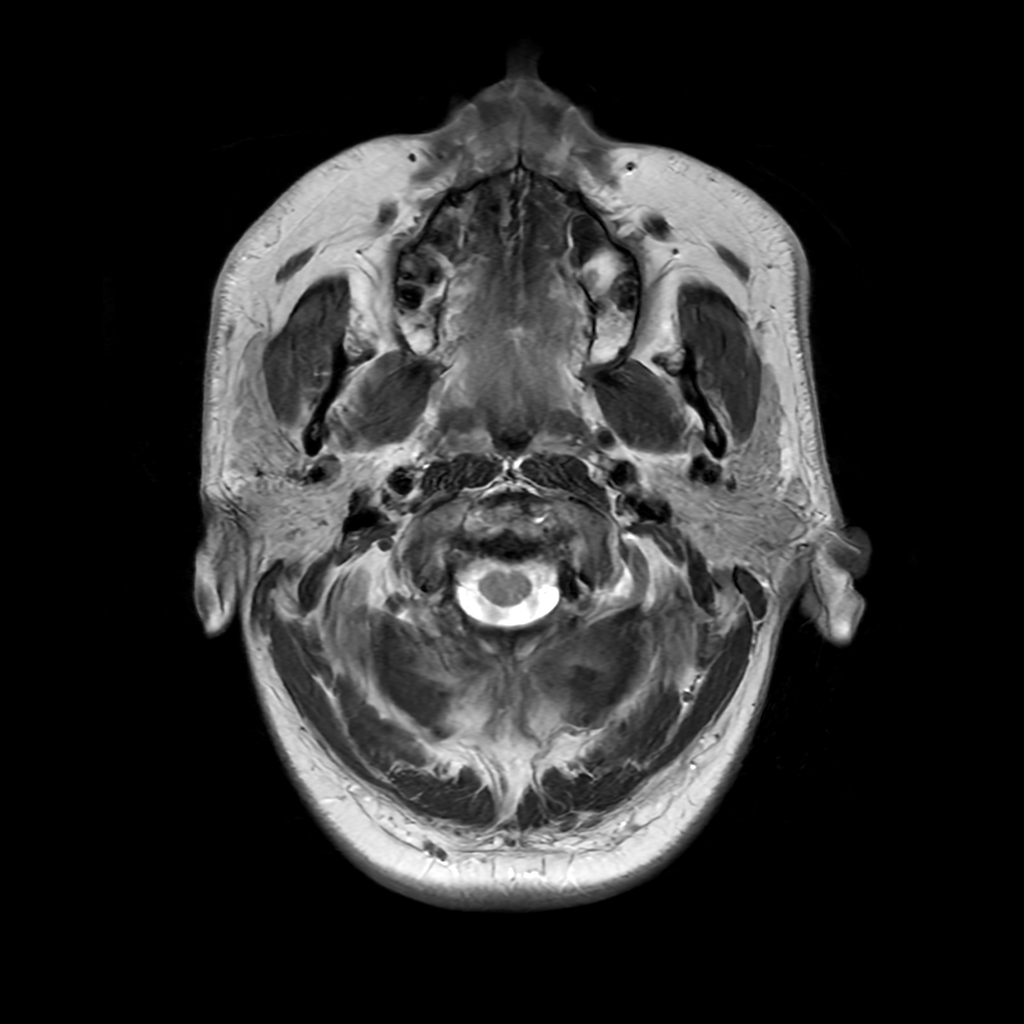

[Series 7: FLAIR · axial · 3.0mm · 0.45mm/px · z∈[-24,+125]mm · 2 of 26 slices shown (2 of 2)]
[im 1/26]
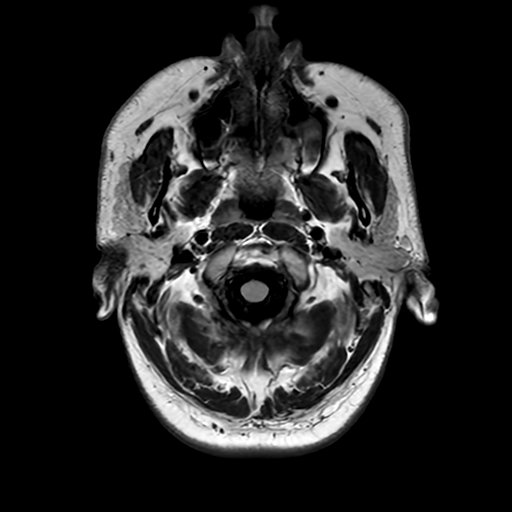
[im 26/26]
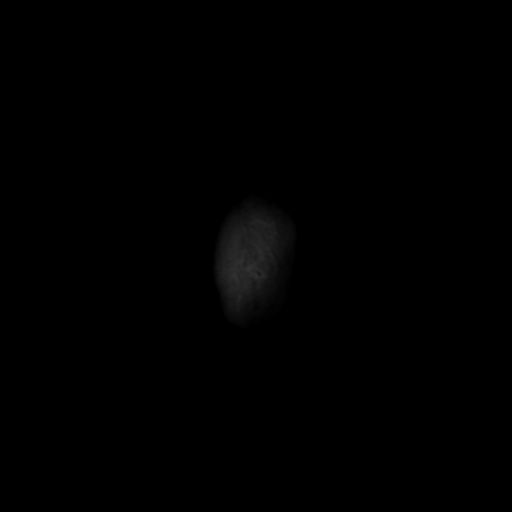

[Series 350: ADC · axial · 3.0mm · 0.94mm/px · z∈[-32,+121]mm · 4 of 51 slices shown]
[im 1/51]
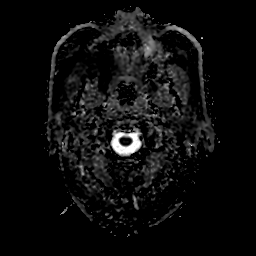
[im 17/51]
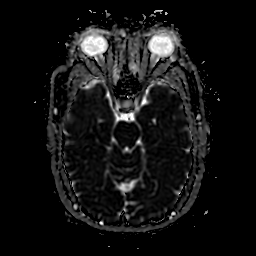
[im 34/51]
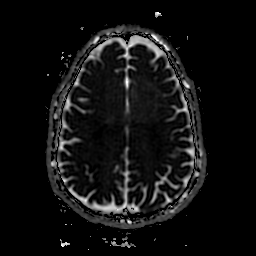
[im 51/51]
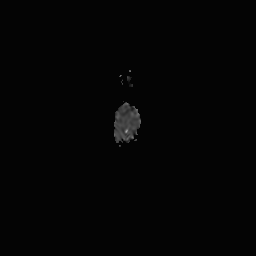

[24 of 48 positions shown; findings below may reference images not displayed]

FINDINGS: BRAIN: Multifocal abnormal diffusion restriction within the
posterior right MCA territory, including along the postcentral gyrus
and within the right parietal lobe and right caudate head. There
also small areas of ischemia within the right occipital lobe, in the
PCA territory. Normal white matter signal for age. Normal volume of
brain parenchyma and CSF spaces. Midline structures are normal.

VASCULAR: Major flow voids are preserved. Susceptibility-sensitive
sequences show no chronic microhemorrhage or superficial siderosis.

SKULL AND UPPER CERVICAL SPINE: Normal calvarium and skull base.
Visualized upper cervical spine and soft tissues are normal.

SINUSES/ORBITS: No paranasal sinus fluid levels or advanced mucosal
thickening. No mastoid or middle ear effusion. Normal orbits.
IMPRESSION: 1. Multifocal acute ischemia within the right middle and posterior
cerebral artery territories. Given the presence of a right P-comm,
this pattern is compatible with emboli from the right carotid
system.
2. No hemorrhage or mass effect.

## 2022-02-02 MED ORDER — PERMETHRIN 1 % TOPICAL LIQUID
1 % | TOPICAL | Status: AC
  Administered 2022-02-03: 09:00:00 via TOPICAL

## 2022-02-02 MED ORDER — PERMETHRIN 5 % TOPICAL CREAM
5 % | TOPICAL | Status: AC
  Administered 2022-02-03: 09:00:00 via TOPICAL

## 2022-02-03 MED FILL — PERMETHRIN 5 % TOPICAL CREAM: 5 % | TOPICAL | Qty: 60

## 2022-02-03 MED FILL — NIX CREME RINSE 1 % TOPICAL LIQUID: 1 % | TOPICAL | Qty: 1

## 2022-02-11 LAB — POCT COVID-19 RNA, QUALITATIVE RAPID: POCT COVID-19 RNA, Qualitative, Rapid: NOT DETECTED

## 2022-02-11 NOTE — ED Provider Notes (Signed)
EMERGENCY DEPARTMENT PHYSICIAN NOTE        ED Attending(s): [place] 02/11/22 00:46    Chief Complaint   Patient presents with    Concern for current CDC Advisory     Pt requesting covid test. Pt states he needs result to be let back into methadone clinic          HISTORY       Interpreter used? (Optional): No      Phillip Gentry is a 46 y.o. old male, with hx of SCC of mandible in remission presenting with request for COVID screening iso of cough, rhinorrhea a couple day ago, now resolved.     Denies any fevers.    Allergies/Contraindications  No Known Allergies           Social History     Socioeconomic History    Marital status: Single           PHYSICAL EXAM       Triage Vital Signs:  BP: 128/85, Pulse - Palpated/Pleth: 100, Temp: 36.9 C (98.4 F), *Resp: 18, SpO2: 97 %    Physical Exam  Vitals reviewed.   Constitutional:       Appearance: Normal appearance.   HENT:      Head: Atraumatic.      Nose: Nose normal. No congestion or rhinorrhea.      Mouth/Throat:      Mouth: Mucous membranes are moist.      Pharynx: Oropharynx is clear.   Eyes:      Pupils: Pupils are equal, round, and reactive to light.   Cardiovascular:      Rate and Rhythm: Normal rate and regular rhythm.      Pulses: Normal pulses.   Pulmonary:      Effort: Pulmonary effort is normal.   Abdominal:      General: Abdomen is flat.   Musculoskeletal:         General: Normal range of motion.      Cervical back: Normal range of motion.   Skin:     General: Skin is warm.      Capillary Refill: Capillary refill takes less than 2 seconds.   Neurological:      Mental Status: He is alert.   Psychiatric:         Mood and Affect: Mood normal.         Behavior: Behavior normal.         Medical Decision Making               Medical Decision Making              Final Disposition and ED Course                                     Edon Hoadley Tarry Kos, MD  02/11/22 [phone]

## 2022-02-11 NOTE — Discharge Instructions (Addendum)
You were seen in the emergency department for evaluation of COVID. While you were here we did a physical exam and COVID test. All of your tests were normal and did not show a cause of your symptoms. You likely had another viral illness not COVID.    You should see your primary care provider over the next few days to ensure your symptoms are not worsening. If your symptoms worsen or if you develop a fever, or any other concerning symptom you should come back to the emergency department for further evaluation.     YOUR COVID TEST WAS NEGATIVE.

## 2022-05-12 ENCOUNTER — Ambulatory Visit: Admit: 2022-05-12 | Discharge: 2022-06-01 | Payer: MEDICAID | Attending: Physician | Primary: Physician

## 2022-05-12 DIAGNOSIS — C109 Malignant neoplasm of oropharynx, unspecified: Secondary | ICD-10-CM

## 2022-05-12 NOTE — Progress Notes (Signed)
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  Phillip Gentry is a 46 y.o. male with a history of squamous cell carcinoma of the mandible currently with no evidence of disease recurrence who was referred to establish Head and neck squamous cell carcinoma surveillance.  A clinical & surgical evaluation was requested.   The clinical history is as follows:      The patient is homeless and does not have Head and neck squamous cell carcinoma surveillance following a prior diagnosis of squamous cell carcinoma, apparently of the mandible. He is a poor historian who is unable to provide any history regarding his prior treatment, prior diagnosis, and which doctors were following him or where.    The patient denies symptoms associated with an upper aerodigestive tract malignancy, such as dysphagia, dysphonia, throat pain, weight loss, dysarthria, radiating ear pain, aspiration, bleeding, etc.       Prior work up  Outside records reviewed from referring provider; summary of records includes the following: MD notes, imaging records, pathology reports  (See below and outside records for formal interpretations.)    Medical History  There is no reported history of external beam radiation to the neck or chest.   No past medical history on file.  # Head and neck squamous cell carcinoma - per patient/no records    Surgical History  No past surgical history on  file.    Medications  No current outpatient medications on file.     No current facility-administered medications for this visit.       Allergies  Allergies/Contraindications  No Known Allergies    Social History  The patient denies significant tobacco and alcohol use history. There is no history of drug use.   Social History     Socioeconomic History    Marital status: Single     Spouse name: Not on file    Number of children: Not on file    Years of education: Not on file    Highest education level: Not on file   Occupational History    Not on file   Tobacco Use    Smoking status: Never     Passive exposure: Never    Smokeless tobacco: Never   Substance and Sexual Activity    Alcohol use: Not on file    Drug use: Not on file    Sexual activity: Not on file   Other Topics Concern    Not on file   Social History Narrative    Not on file     Social Determinants of Health     Financial Resource Strain: Not on file   Food Insecurity: Not on file   Transportation Needs: Not on file     Family History   No family history on file.    Physical Exam  Vital Signs: BP 125/71   Pulse 95   Temp 37.6 C (99.6 F) (Temporal)   Resp 16   Ht 170.2 cm (5' 7.01") Comment: 04/2022 @ [facility]  Wt 62.3 kg (137 lb 6.4 oz)   SpO2 97%   BMI 21.51 kg/m   General:   Well-appearing, well-nourished.  In no apparent distress.   Voice/Breathing: Voice is strong. No respiratory difficulty. No stridor.  Psychiatric:  Alert/oriented x3  Eyes:    EOMI, sclera white, no nystagmus.    Ears:    Normal pinnae, external auditory canals. Tympanic membranes     without middle ear effusions bilaterally.  Nose:   Clear nasal passages bilaterally.    Face:    Face symmetric.  No palpable or obvious masses.   OC/OP:   Visually normal lips, teeth, gums, tongue, floor of mouth.     Normal buccal mucosa, tonsillar fossae, and base of tongue.      Oropharynx is clear of lesions  Lymph/Neck: No palpable lymphadenopathy.  Trachea is midline.      No thyromegaly.   No palpable thyroid nodules.  Musculoskeltal:  Full range of motion of neck and shoulders.   Neurological:  Cranial nerves II-XII intact, EOMI, PERRLA, V1-3 SILT, FN 1/6 HB b/l,     hearing grossly intact, palate rise symmetric, 5/5 shoulder shrug,     tongue protrudes midline and is mobile.   Skin:   No suspicious lesions or rashes on the head, facial, or neck skin.  Cardiovascular: Regular rate and rhythm, no m/r/g  Respiratory:   Normal symmetric breathing.   Extremities:  No evidence of edema.      PROCEDURE: Flexible Fiberoptic Laryngoscopy (CPT [id number])  -- Indication: Detailed view of upper aerodigestive tract required to evaluate for pathology and laryngeal function.  -- Technique: After verbal consent was obtained from the patient, the endoscope was advanced to evaluate the oropharynx, hypopharynx, and larynx.    -- Findings: Normal oropharynx, hypopharynx, and larynx.  Normal bilateral symmetrical vocal cord movement and laryngeal function.  Normal inferior tonsillar fossae, base of tongue, vallecula, epiglottis, laryngeal epiglottis, aryepiglottc folds, arytenoids, false vocal cords, true vocal cords, piriform sinus, piriform sinus apices, posterior cricoid area, and posterior pharyngeal walls. Pharyngeal wall motion intact. Larynx:  Full abduction and adduction of bilateral vocal folds, there are no lesions of the glottis or supraglottis. Bilateral vocal folds show no lesions along the superior surface of the folds.   Summary: Normal anatomy of the upper aerodigestive tract.  No mucosal lesions of concern are noted. Intact true vocal fold mobility is noted.     PROCEDURE NOTE:  Ultrasound, soft tissues of the head and neck (thyroid, parathyroid, parotid and submandibular glands, central and lateral neck compartments, with real time image documentation (CPT [id number])  RE: Phillip Gentry  U#: [id number]  DATE OF SERVICE: 6.15.23  PROVIDER: Sherryll Burger MD  INDICATIONS: A 46 y.o. y.o.-year-old male with a history  of Head and neck squamous cell carcinoma.  COMPARISON: None  FINDINGS: High-resolution real-time ultrasonography of the neck is performed with the patient in the supine position. Doppler sonography was used where appropriate.   1. Thyroid:  Homogeneous echotexture.  No nodules concerning for malignancy.  2. Central & Lateral Neck: No concerning lymphadenopathy  3. Salivary Glands:  -- Submandibular glands: The submandibular glands were sonographically normal.   -- Parotid glands: The left and right parotid glands were homogeneous.     Impression:   1. Thyroid: No nodules concerning  for malignancy.  2. Central & Lateral Neck: No concerning lymphadenopathy.  3. Salivary Glands: Sonographically normal.     END OF IMPRESSION      Imaging Review    # [place] US thyroid/neck 6.15.23:  Impression:   1. Thyroid: No nodules concerning for malignancy.  2. Central & Lateral Neck: No concerning lymphadenopathy.  3. Salivary Glands: Sonographically normal.     I have reviewed the above imaging myself and agree with the findings.     Pathology Review    None available.    I have reviewed the above pathology results myself and agree with the findings.     Lab Studies Review  None available.     I have reviewed the above labs myself and agree with the findings.     Impression/Plan  Phillip Gentry is a 46 y.o. male with history of squamous cell carcinoma of the mandible currently with no evidence of disease recurrence who was referred to establish head and neck squamous cell carcinoma surveillance.  He has no evidence of disease recurrence at this time.  I will order imaging and requested him to return to clinic q33mo for surveillance follow-up, but he indicated this may be unlikely as he intends to return to be homeless on the streets. I am available for surveillance follow-up if he ever wants to return.  My clinical plan is as follows:     # Head and neck squamous cell carcinoma:  no evidence of disease recurrence   - plan:  surveillance follow-up       Counseling Summary  I spent a total of 40 minutes on this patient's care on the day of their visit excluding time spent related to any billed procedures. This time includes time spent with the patient as well as time spent documenting in the medical record, reviewing patient's records and tests, obtaining history, placing orders, communicating with other healthcare professionals, counseling the patient, family, or caregiver, and/or care coordination for the diagnoses above.    Phillip Ivory GIona Beard MD, MPH  Head and Neck Surgical Oncology  [facility] Phillip [email or web address]    text: [phone]   Electronically signed by JSuzzette Righter MD, MPH, [place] [facility], [facility] 05/12/2022 3:33 PM.

## 2022-09-26 LAB — COMPREHENSIVE METABOLIC PANEL (BMP, AST, ALT, T.BILI, ALKP, TP ALB)
AST: 16 U/L (ref 5–44)
Alanine transaminase: 10 U/L (ref 10–61)
Albumin, Serum / Plasma: 4.1 g/dL (ref 3.5–5.0)
Alkaline Phosphatase: 60 U/L (ref 38–108)
Anion Gap: 9 (ref 4–14)
Bilirubin, Total: 0.3 mg/dL (ref 0.2–1.2)
Calcium, total, Serum / Plasma: 9.5 mg/dL (ref 8.4–10.5)
Carbon Dioxide, Total: 26 mmol/L (ref 22–29)
Chloride, Serum / Plasma: 106 mmol/L (ref 101–110)
Creatinine: 1.48 mg/dL — ABNORMAL HIGH (ref 0.73–1.24)
Glucose, non-fasting: 81 mg/dL (ref 70–199)
Potassium, Serum / Plasma: 3.4 mmol/L — ABNORMAL LOW (ref 3.5–5.0)
Protein, Total, Serum / Plasma: 7.7 g/dL (ref 6.3–8.6)
Sodium, Serum / Plasma: 141 mmol/L (ref 135–145)
Urea Nitrogen, Serum / Plasma: 20 mg/dL (ref 7–25)
eGFRcr: 58 mL/min/{1.73_m2} — ABNORMAL LOW (ref >59)

## 2022-09-26 LAB — COMPLETE BLOOD COUNT WITH DIFFERENTIAL
Abs Basophils: 0.05 10*9/L (ref 0.00–0.10)
Abs Eosinophils: 0.24 10*9/L (ref 0.00–0.40)
Abs Imm Granulocytes: 0.02 10*9/L (ref <0.10)
Abs Lymphocytes: 1.14 10*9/L (ref 1.00–3.40)
Abs Monocytes: 0.73 10*9/L (ref 0.20–0.80)
Abs Neutrophils: 4.62 10*9/L (ref 1.80–6.80)
Hematocrit: 41.1 % (ref 41.0–53.0)
Hemoglobin: 12.9 g/dL — ABNORMAL LOW (ref 13.6–17.5)
MCH: 29.1 pg (ref 26.0–34.0)
MCHC: 31.4 g/dL (ref 31.0–36.0)
MCV: 93 fL (ref 80–100)
MPV: 9.2 fL (ref 9.1–12.6)
Platelet Count: 306 10*9/L (ref 140–450)
RBC Count: 4.43 10*12/L (ref 4.40–5.90)
RDW-CV: 13.8 % (ref 11.7–14.4)
WBC Count: 6.8 10*9/L (ref 3.4–10.0)

## 2022-09-26 LAB — C-REACTIVE PROTEIN: C-Reactive Protein: 4.5 mg/L (ref <5.1)

## 2022-09-26 LAB — SEDIMENTATION RATE: Sedimentation Rate: 17 mm/h — ABNORMAL HIGH (ref 0–10)

## 2022-09-26 LAB — TROPONIN I: Troponin I: <0.02 ug/L (ref 0.00–0.04)

## 2022-09-26 LAB — LIPASE: Lipase: 5 U/L — ABNORMAL LOW (ref 8–78)

## 2022-09-26 LAB — B-TYPE NATRIURETIC PEPTIDE: BNP: <5 pg/mL (ref <40)

## 2022-09-26 MED ORDER — ACETAMINOPHEN 500 MG TABLET
500 mg | ORAL | Status: AC
  Administered 2022-09-26: 11:00:00 1000 mg via ORAL

## 2022-09-26 MED ORDER — NALOXONE 0.4 MG/ML INJECTION SOLUTION: 0.4 mg/mL | INTRAMUSCULAR | Status: DC | PRN

## 2022-09-26 MED ORDER — METHADONE 10 MG TABLET
10 mg | ORAL | Status: AC
  Administered 2022-09-26: 11:00:00 100 mg via ORAL

## 2022-09-26 MED FILL — METHADONE 10 MG TABLET: 10 mg | ORAL | Qty: 10

## 2022-09-26 MED FILL — ACETAMINOPHEN 500 MG TABLET: 500 mg | ORAL | Qty: 2

## 2022-09-26 NOTE — Discharge Instructions (Signed)
Hello Phillip Gentry     It was a pleasure caring for you in the [facility] Emergency Department.    You were seen here for methadone withdrawal, blood in your stool, and dental issues.    In the emergency department, we ran blood tests and a chest xray and did not find any life threatening causes for your symptoms.     Continue to take your methadone as prescribed.    Please follow up with your Primary Care Provider as soon as possible. We recommend you call your insurance to obtain a primary care doctor assigned to you. As a reminder, a visit to the Emergency Department is not a substitute for comprehensive care, and close follow-up with your primary care provider is important for holistic care.     Please return to the emergency department if you experience:  - worsened pain  - inability to eat or drink  - fevers or chills/rigors  - pure blood from stool/worsening of bleeding  - passing out/loss of consciousness  - chest pain  - Any new or concerning symptom.

## 2022-09-26 NOTE — ED Provider Notes (Signed)
EMERGENCY DEPARTMENT PHYSICIAN NOTE      ED Attending(s):     Chief Complaint   Patient presents with    Emesis     One street opiate use in last 2 months. Went to methadone clinic late in day to get dose and felt like he was withdrawal. Vomited dose after clinic closed. C/o diarrhea and nausea since, feels it is due to opiate w/d        HISTORY     Interpreter used? (Optional): No    Phillip Gentry is a 46 y.o. 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  Takes methadone 200 at [facility]; on 10/29 arrived to clinic late at [facility] and was in withdrawal and had severe nausea and was unable to tolerate his 10/29 dose; since has subsequently felt in withdrawal with nausea, generalized pain, restlessness  Endorses >1y of bright red blood per rectum a/w weight loss. Believes he may have rectal cancer or colon cancer but has been unable to get colonoscopy as does not have PCP in [place] and is currently homeless  Dental pain: had radiation to head/neck for cancer and has poor teeth iso drug use, has been seen at oral surgery clinic at [facility] and they were concerned about teeth extraction causing osteoradionecrosis given radiation history and recommending oncology team mapping records from [place] prior to proceeding; endorses continued pain; denies f/c; able to tolerate PO  Overall, denies f/c, ha, vision change, numbness/tingling, throat closing sensation, difficulty tolerating PO, dysuria, hematuria, penile discharge, difficulty ambulating  Currently unhoused, originally from [place], [place]. Received SCC treatment at [facility]. Does not have a PCP.    Allergies/Contraindications  No Known Allergies           Social History     Socioeconomic History    Marital status: Unknown/Declined   Tobacco Use    Smoking status: Never     Passive exposure: Never    Smokeless tobacco: Never           PHYSICAL EXAM     Triage Vital  Signs:  BP: 110/80, Heart Rate: 102, Temp: 37.2 C (98.9 F), *Resp: 16, SpO2: 96 %    Physical Exam  Vitals reviewed.   Constitutional:       General: He is not in acute distress.     Appearance: Normal appearance.      Comments: Cachectic, fecal matter on pants   HENT:      Head: Normocephalic and atraumatic.      Mouth/Throat:      Comments: Poor teeth with multiple carries, fragmented teeth, and missing teeth  Inflammed gums  Eyes:      Extraocular Movements: Extraocular movements intact.      Pupils: Pupils are equal, round, and reactive to light.   Cardiovascular:      Rate and Rhythm: Normal rate and regular rhythm.      Pulses: Normal pulses.      Heart sounds: Normal heart sounds. No murmur heard.  Pulmonary:      Effort: Pulmonary effort is normal. No respiratory distress.      Breath sounds: Normal breath sounds.   Abdominal:      Palpations: Abdomen is soft.      Tenderness: There is no abdominal tenderness. There is no guarding or rebound.   Genitourinary:     Comments: Rectal exam w/ brown stool no  signs of blood  External hemorrhoid present  Musculoskeletal:      Cervical back: Normal range of motion.      Right lower leg: No edema.      Left lower leg: No edema.   Skin:     General: Skin is warm.      Capillary Refill: Capillary refill takes less than 2 seconds.   Neurological:      General: No focal deficit present.      Mental Status: He is alert and oriented to person, place, and time. Mental status is at baseline.   Psychiatric:         Mood and Affect: Mood normal.       Medical Decision Making           Medical Decision Making  Phillip Gentry is a 46 y.o. year old male p/w multiple considers including BRBPR, methadone withdrawal, and dental issues. Vitals reassuring. Exam notable poor dental quality and cachectic appearing male. Ddx: nausea/vomit iso likely methadone withdrawal given history. Low suspicion acute abdominal pathology given nontender abdomen and history. Unlikely cardiopulmonary  given benign exam. BRBPR consider hemorrhoid given exam vs malignancy given age/poor medical compliance. Unlikely acute GI bleed given quality of stool and history. Dental issues likely from previous stubstance use and radiation, low suspicion active infection vs abscess vs necrotizing infection given exam.   - labs  - methadone  - analgesia  - reassess    Amount and/or Complexity of Data Reviewed  External Data Reviewed: notes.  Labs: ordered. Decision-making details documented in ED Course.    Risk  Prescription drug management.  Drug therapy requiring intensive monitoring for toxicity.  Diagnosis or treatment significantly limited by social determinants of health.  Risk Details: SDH: unhoused without access to care              Final Disposition and ED Course                         ED Course as of 09/26/22 Catharine Documentation   Mon Sep 26, 2022   0443 WBC Count: 6.8  No leukocytosis   0443 Hemoglobin(!): 12.9  No anemia        Luanna Cole, MD  Resident  09/26/22 [id number]       Ronalee Red, MD  09/26/22 [phone]

## 2023-02-13 ENCOUNTER — Ambulatory Visit: Admit: 2023-02-13 | Discharge: 2023-02-28 | Payer: MEDICAID | Attending: Physician | Primary: Physician

## 2023-02-13 DIAGNOSIS — C109 Malignant neoplasm of oropharynx, unspecified: Secondary | ICD-10-CM

## 2023-02-13 NOTE — Progress Notes (Signed)
[place] [facility]     Head & Neck Surgical Oncology  Thyroid & Parathyroid Surgery   Melanoma & Cutaneous Oncology     Follow-up visit     Date of Service: 02/13/23      Patient Name:  Kenyen Ceron      DOB:  21-Nov-1976      MEDICAL RECORD NUMBER [id number]  Referring Physician: Gayland Curry,*  Other treating physicians: None Per Patient Provider      Visit Diagnosis / Reason for Evaluation   oropharyngeal squamous cell carcinoma       History of Present Illness  MR Shloimy Boren is a 47 y.o. 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    # 02/13/23:  Doing well, without issues. Still homeless. The patient denies symptoms associated with an upper aerodigestive tract malignancy, such as dysphagia, dysphonia, throat pain, weight loss, dysarthria, radiating ear pain, aspiration, bleeding, etc.       Cancer Treatment  The patient is homeless and does not have Head and neck squamous cell carcinoma surveillance following a prior diagnosis of squamous cell carcinoma, apparently of the mandible. He is a poor historian who is unable to provide any history regarding his prior treatment, prior diagnosis, and which doctors were following him or where.            Prior work up  Outside records reviewed from referring provider; summary of records includes the following: MD notes, imaging records, pathology reports  (See below and outside records for formal interpretations.)     Physical Exam   Vital Signs: There were no vitals taken for this visit.   General:              Well-appearing, well-nourished.  In no apparent distress.   Voice/Breathing: Voice is strong. No respiratory difficulty. No stridor.  Psychiatric:         Alert/oriented x3  Eyes:                     EOMI, sclera white, no nystagmus.    Ears:                     Normal pinnae,  external auditory canals. Tympanic membranes                                 without middle ear effusions bilaterally.  Nose:                    Clear nasal passages bilaterally.    Face:                     Face symmetric.  No palpable or obvious masses.   OC/OP:                No obvious exposed ORN but multiple dental sockets noted    Extremely poor remaining dentition quality                                Normal buccal mucosa, tonsillar fossae, and base of tongue.  Oropharynx is clear of lesions  Lymph/Neck: No palpable lymphadenopathy.  Trachea is midline.                                  No thyromegaly.  No palpable thyroid nodules.  Musculoskeltal:  Full range of motion of neck and shoulders.   Neurological:      Cranial nerves II-XII intact, EOMI, PERRLA, V1-3 SILT, FN 1/6 HB b/l,                                 hearing grossly intact, palate rise symmetric, 5/5 shoulder shrug,                                 tongue protrudes midline and is mobile.   Skin:                     No suspicious lesions or rashes on the head, facial, or neck skin.  Cardiovascular: Regular rate and rhythm, no m/r/g  Respiratory:        Normal symmetric breathing.   Extremities:        No evidence of edema.         PROCEDURE NOTE:  Ultrasound, soft tissues of the head and neck (thyroid, parathyroid, parotid and submandibular glands, central and lateral neck compartments, with real time image documentation (CPT [id number])  RE: Arush Mica  U#: [id number]  DATE OF SERVICE: 02/13/23   PROVIDER: Sherryll Burger MD  INDICATIONS: A 47 y.o. y.o.-year-old male with a history of Head and neck squamous cell carcinoma.  COMPARISON: US thyroid/neck 6.15.23  FINDINGS: High-resolution real-time ultrasonography of the neck is performed with the patient in the supine position. Doppler sonography was used where appropriate.   1. Thyroid:  Homogeneous echotexture.  No nodules concerning for malignancy.  2. Central & Lateral  Neck: No concerning lymphadenopathy  3. Salivary Glands:  -- Submandibular glands: The submandibular glands were sonographically normal.   -- Parotid glands: The left and right parotid glands were homogeneous.      Impression:   1. Thyroid: No nodules concerning for malignancy.  2. Central & Lateral Neck: No concerning lymphadenopathy.  3. Salivary Glands: Sonographically normal.      END OF IMPRESSION     Imaging Review     # [place] US thyroid/neck 6.15.23:  Impression:   1. Thyroid: No nodules concerning for malignancy.  2. Central & Lateral Neck: No concerning lymphadenopathy.  3. Salivary Glands: Sonographically normal.        I have reviewed the above imaging myself and agree with the findings.      Pathology Review     None available.     I have reviewed the above pathology results myself and agree with the findings.      Lab Studies Review  None available.      I have reviewed the above labs myself and agree with the findings.      Impression/Plan  Mykell Etchells is a 47 y.o. male with history of squamous cell carcinoma of the oropharynx status post chemoradiation therapy, currently with no evidence of disease recurrence who was referred to establish head and neck squamous cell carcinoma surveillance.  He has no evidence of disease recurrence at this time.  I will order  MR face/naso/neck and will refer him to Dental Onc per his request for further evaluation regarding his dentition. My clinical plan is as follows:      # Oropharyngeal squamous cell carcinoma:  no evidence of disease recurrence   - plan: surveillance follow-up         Counseling Summary  I spent a total of 40 minutes on this patient's care on the day of their visit excluding time spent related to any billed procedures. This time includes time spent with the patient as well as time spent documenting in the medical record, reviewing patient's records and tests, obtaining history, placing orders, communicating with other healthcare professionals,  counseling the patient, family, or caregiver, and/or care coordination for the diagnoses above.     Jerrel Ivory. Iona Beard, MD, MPH  Head and Neck Surgical Oncology  [facility]  [email or web address]    text: [phone]     Electronically signed by Suzzette Righter, MD, MPH, [place] [facility], [facility], 02/13/23
